# Patient Record
Sex: Female | Born: 1935 | Race: White | Hispanic: No | Marital: Married | Smoking: Former smoker
Health system: Southern US, Community
[De-identification: ages and names within clinical notes are randomized; demographics above are authoritative.]

## PROBLEM LIST (undated history)

## (undated) DIAGNOSIS — F329 Major depressive disorder, single episode, unspecified: Secondary | ICD-10-CM

## (undated) DIAGNOSIS — E119 Type 2 diabetes mellitus without complications: Secondary | ICD-10-CM

## (undated) DIAGNOSIS — N393 Stress incontinence (female) (male): Secondary | ICD-10-CM

## (undated) DIAGNOSIS — I10 Essential (primary) hypertension: Secondary | ICD-10-CM

## (undated) DIAGNOSIS — E7800 Pure hypercholesterolemia, unspecified: Secondary | ICD-10-CM

## (undated) DIAGNOSIS — M1990 Unspecified osteoarthritis, unspecified site: Secondary | ICD-10-CM

## (undated) DIAGNOSIS — F32A Depression, unspecified: Secondary | ICD-10-CM

## (undated) DIAGNOSIS — N390 Urinary tract infection, site not specified: Secondary | ICD-10-CM

## (undated) DIAGNOSIS — E079 Disorder of thyroid, unspecified: Secondary | ICD-10-CM

## (undated) DIAGNOSIS — K219 Gastro-esophageal reflux disease without esophagitis: Secondary | ICD-10-CM

## (undated) DIAGNOSIS — I639 Cerebral infarction, unspecified: Secondary | ICD-10-CM

## (undated) HISTORY — DX: Pure hypercholesterolemia, unspecified: E78.00

## (undated) HISTORY — DX: Gastro-esophageal reflux disease without esophagitis: K21.9

## (undated) HISTORY — DX: Cerebral infarction, unspecified: I63.9

## (undated) HISTORY — DX: Disorder of thyroid, unspecified: E07.9

## (undated) HISTORY — PX: BACK SURGERY: SHX140

## (undated) HISTORY — DX: Stress incontinence (female) (male): N39.3

## (undated) HISTORY — DX: Urinary tract infection, site not specified: N39.0

## (undated) HISTORY — DX: Unspecified osteoarthritis, unspecified site: M19.90

## (undated) HISTORY — DX: Major depressive disorder, single episode, unspecified: F32.9

## (undated) HISTORY — DX: Type 2 diabetes mellitus without complications: E11.9

## (undated) HISTORY — DX: Essential (primary) hypertension: I10

## (undated) HISTORY — DX: Depression, unspecified: F32.A

---

## 1975-11-20 HISTORY — PX: ABDOMINAL HYSTERECTOMY: SHX81

## 1977-11-19 HISTORY — PX: APPENDECTOMY: SHX54

## 2004-08-19 ENCOUNTER — Ambulatory Visit: Payer: Self-pay

## 2004-09-19 ENCOUNTER — Ambulatory Visit: Payer: Self-pay

## 2007-03-04 ENCOUNTER — Other Ambulatory Visit: Payer: Self-pay

## 2007-03-04 ENCOUNTER — Observation Stay: Payer: Self-pay | Admitting: Internal Medicine

## 2007-11-05 ENCOUNTER — Emergency Department: Payer: Self-pay | Admitting: Internal Medicine

## 2007-11-05 ENCOUNTER — Other Ambulatory Visit: Payer: Self-pay

## 2010-02-08 ENCOUNTER — Ambulatory Visit: Payer: Self-pay | Admitting: Gastroenterology

## 2011-02-12 ENCOUNTER — Observation Stay: Payer: Self-pay | Admitting: Internal Medicine

## 2012-12-03 ENCOUNTER — Inpatient Hospital Stay: Payer: Self-pay | Admitting: Internal Medicine

## 2012-12-03 LAB — CBC
HCT: 31.1 % — ABNORMAL LOW (ref 35.0–47.0)
HGB: 10.6 g/dL — ABNORMAL LOW (ref 12.0–16.0)
MCH: 29.8 pg (ref 26.0–34.0)
MCHC: 33.9 g/dL (ref 32.0–36.0)
MCV: 88 fL (ref 80–100)
Platelet: 289 10*3/uL (ref 150–440)
RDW: 14.6 % — ABNORMAL HIGH (ref 11.5–14.5)
WBC: 14.7 10*3/uL — ABNORMAL HIGH (ref 3.6–11.0)

## 2012-12-03 LAB — URINALYSIS, COMPLETE
Bilirubin,UR: NEGATIVE
Blood: NEGATIVE
Hyaline Cast: 2
Nitrite: NEGATIVE
Protein: 100
Specific Gravity: 1.02 (ref 1.003–1.030)
Squamous Epithelial: 1

## 2012-12-03 LAB — COMPREHENSIVE METABOLIC PANEL
Albumin: 2.6 g/dL — ABNORMAL LOW (ref 3.4–5.0)
Alkaline Phosphatase: 128 U/L (ref 50–136)
Anion Gap: 8 (ref 7–16)
BUN: 26 mg/dL — ABNORMAL HIGH (ref 7–18)
Bilirubin,Total: 0.5 mg/dL (ref 0.2–1.0)
Calcium, Total: 9.1 mg/dL (ref 8.5–10.1)
Chloride: 93 mmol/L — ABNORMAL LOW (ref 98–107)
Co2: 32 mmol/L (ref 21–32)
Creatinine: 0.72 mg/dL (ref 0.60–1.30)
EGFR (African American): >60
Glucose: 177 mg/dL — ABNORMAL HIGH (ref 65–99)
Osmolality: 275 (ref 275–301)
SGPT (ALT): 18 U/L (ref 12–78)

## 2012-12-03 LAB — RAPID INFLUENZA A&B ANTIGENS

## 2012-12-03 LAB — TROPONIN I: Troponin-I: <0.02 ng/mL

## 2012-12-04 LAB — CBC WITH DIFFERENTIAL/PLATELET
Bands: 7 %
HGB: 9.1 g/dL — ABNORMAL LOW (ref 12.0–16.0)
Lymphocytes: 13 %
MCHC: 32.1 g/dL (ref 32.0–36.0)
Monocytes: 6 %
Platelet: 310 10*3/uL (ref 150–440)
RDW: 14.8 % — ABNORMAL HIGH (ref 11.5–14.5)

## 2012-12-04 LAB — BASIC METABOLIC PANEL
Anion Gap: 8 (ref 7–16)
Calcium, Total: 8.2 mg/dL — ABNORMAL LOW (ref 8.5–10.1)
Chloride: 101 mmol/L (ref 98–107)
Creatinine: 0.65 mg/dL (ref 0.60–1.30)
EGFR (African American): >60
Potassium: 3.3 mmol/L — ABNORMAL LOW (ref 3.5–5.1)
Sodium: 136 mmol/L (ref 136–145)

## 2012-12-04 LAB — FERRITIN: Ferritin (ARMC): 319 ng/mL (ref 8–388)

## 2012-12-05 LAB — CBC WITH DIFFERENTIAL/PLATELET
Basophil %: 0.3 %
Eosinophil #: 0 10*3/uL (ref 0.0–0.7)
Eosinophil %: 0.2 %
HCT: 27.9 % — ABNORMAL LOW (ref 35.0–47.0)
HGB: 8.8 g/dL — ABNORMAL LOW (ref 12.0–16.0)
Lymphocyte #: 0.8 10*3/uL — ABNORMAL LOW (ref 1.0–3.6)
MCHC: 31.6 g/dL — ABNORMAL LOW (ref 32.0–36.0)
MCV: 88 fL (ref 80–100)
Monocyte %: 5.2 %
Neutrophil %: 90 %
Platelet: 357 10*3/uL (ref 150–440)
RBC: 3.17 10*6/uL — ABNORMAL LOW (ref 3.80–5.20)

## 2012-12-05 LAB — BASIC METABOLIC PANEL
Anion Gap: 10 (ref 7–16)
BUN: 8 mg/dL (ref 7–18)
Calcium, Total: 8.4 mg/dL — ABNORMAL LOW (ref 8.5–10.1)
Chloride: 103 mmol/L (ref 98–107)
Co2: 22 mmol/L (ref 21–32)
EGFR (African American): >60
EGFR (Non-African Amer.): >60
Glucose: 198 mg/dL — ABNORMAL HIGH (ref 65–99)

## 2012-12-05 LAB — OCCULT BLOOD X 1 CARD TO LAB, STOOL: Occult Blood, Feces: NEGATIVE

## 2012-12-05 LAB — URINE CULTURE

## 2012-12-05 LAB — CLOSTRIDIUM DIFFICILE BY PCR

## 2012-12-06 LAB — BASIC METABOLIC PANEL
Anion Gap: 8 (ref 7–16)
BUN: 8 mg/dL (ref 7–18)
Calcium, Total: 8.7 mg/dL (ref 8.5–10.1)
Co2: 26 mmol/L (ref 21–32)
Creatinine: 0.58 mg/dL — ABNORMAL LOW (ref 0.60–1.30)
EGFR (African American): >60
Potassium: 4.4 mmol/L (ref 3.5–5.1)

## 2012-12-06 LAB — CBC WITH DIFFERENTIAL/PLATELET
Basophil #: 0.1 10*3/uL (ref 0.0–0.1)
Basophil %: 0.3 %
Eosinophil #: 0 10*3/uL (ref 0.0–0.7)
Eosinophil %: 0.2 %
HGB: 8.6 g/dL — ABNORMAL LOW (ref 12.0–16.0)
Lymphocyte #: 1.7 10*3/uL (ref 1.0–3.6)
Lymphocyte %: 7.8 %
MCH: 28 pg (ref 26.0–34.0)
MCV: 89 fL (ref 80–100)
Monocyte #: 0.8 x10 3/mm (ref 0.2–0.9)
Neutrophil #: 19 10*3/uL — ABNORMAL HIGH (ref 1.4–6.5)
RBC: 3.09 10*6/uL — ABNORMAL LOW (ref 3.80–5.20)
WBC: 21.6 10*3/uL — ABNORMAL HIGH (ref 3.6–11.0)

## 2012-12-06 LAB — RAPID INFLUENZA A&B ANTIGENS

## 2012-12-07 LAB — CBC WITH DIFFERENTIAL/PLATELET
Basophil #: 0.1 10*3/uL (ref 0.0–0.1)
Basophil %: 0.6 %
Eosinophil %: 1.3 %
HCT: 26.4 % — ABNORMAL LOW (ref 35.0–47.0)
HGB: 8.7 g/dL — ABNORMAL LOW (ref 12.0–16.0)
Lymphocyte #: 1 10*3/uL (ref 1.0–3.6)
Lymphocyte %: 6.4 %
MCH: 29.2 pg (ref 26.0–34.0)
MCV: 89 fL (ref 80–100)
Monocyte #: 0.7 x10 3/mm (ref 0.2–0.9)
Monocyte %: 5 %
Neutrophil %: 86.7 %
Platelet: 451 10*3/uL — ABNORMAL HIGH (ref 150–440)
RBC: 2.98 10*6/uL — ABNORMAL LOW (ref 3.80–5.20)
RDW: 15.1 % — ABNORMAL HIGH (ref 11.5–14.5)
WBC: 14.9 10*3/uL — ABNORMAL HIGH (ref 3.6–11.0)

## 2012-12-07 LAB — BASIC METABOLIC PANEL
BUN: 8 mg/dL (ref 7–18)
Chloride: 102 mmol/L (ref 98–107)
Co2: 27 mmol/L (ref 21–32)
Creatinine: 0.53 mg/dL — ABNORMAL LOW (ref 0.60–1.30)
EGFR (Non-African Amer.): >60
Glucose: 183 mg/dL — ABNORMAL HIGH (ref 65–99)
Osmolality: 275 (ref 275–301)
Potassium: 4.4 mmol/L (ref 3.5–5.1)
Sodium: 136 mmol/L (ref 136–145)

## 2012-12-08 LAB — CBC WITH DIFFERENTIAL/PLATELET
Bands: 3 %
HCT: 25 % — ABNORMAL LOW (ref 35.0–47.0)
HGB: 8.4 g/dL — ABNORMAL LOW (ref 12.0–16.0)
Lymphocytes: 20 %
MCHC: 33.8 g/dL (ref 32.0–36.0)
Metamyelocyte: 2 %
Monocytes: 4 %
Myelocyte: 2 %
Platelet: 479 10*3/uL — ABNORMAL HIGH (ref 150–440)
WBC: 9.4 10*3/uL (ref 3.6–11.0)

## 2012-12-09 LAB — CULTURE, BLOOD (SINGLE)

## 2013-04-29 ENCOUNTER — Ambulatory Visit: Payer: Self-pay | Admitting: Family Medicine

## 2013-08-02 ENCOUNTER — Emergency Department: Payer: Self-pay | Admitting: Emergency Medicine

## 2013-08-02 LAB — URINALYSIS, COMPLETE
Bacteria: NONE SEEN
Bilirubin,UR: NEGATIVE
Glucose,UR: NEGATIVE mg/dL (ref 0–75)
Ph: 6 (ref 4.5–8.0)
Protein: 25
RBC,UR: 1 /HPF (ref 0–5)
Squamous Epithelial: 1
WBC UR: 4 /HPF (ref 0–5)

## 2013-08-02 LAB — COMPREHENSIVE METABOLIC PANEL
Albumin: 3.8 g/dL (ref 3.4–5.0)
Alkaline Phosphatase: 71 U/L (ref 50–136)
BUN: 21 mg/dL — ABNORMAL HIGH (ref 7–18)
Co2: 27 mmol/L (ref 21–32)
EGFR (African American): >60
EGFR (Non-African Amer.): >60
Potassium: 4.1 mmol/L (ref 3.5–5.1)
SGOT(AST): 19 U/L (ref 15–37)
SGPT (ALT): 18 U/L (ref 12–78)
Total Protein: 7.8 g/dL (ref 6.4–8.2)

## 2013-08-02 LAB — CBC
HCT: 33.9 % — ABNORMAL LOW (ref 35.0–47.0)
MCHC: 33.3 g/dL (ref 32.0–36.0)
RBC: 3.93 10*6/uL (ref 3.80–5.20)
RDW: 13.7 % (ref 11.5–14.5)

## 2014-03-31 ENCOUNTER — Ambulatory Visit: Payer: Self-pay | Admitting: Adult Health

## 2014-10-27 ENCOUNTER — Ambulatory Visit: Payer: Self-pay | Admitting: Family Medicine

## 2015-03-11 NOTE — Consult Note (Signed)
Impression: 81yo female w/ h/o DM, hypothyroidism and scleroderma admitted with pneumonia and UTI with worsening and leukocytosis.  She states that she had cough, weakness and fever at home.  Her initial CXR did not show an obvious infiltrate, but the CT demonstated an infiltrate.  Her urine showed inflammation and she had a positive culture.  She was improving yesterday, but was more fatigued today with some diarrhea and increased leukocytosis. Would follow her CBC.  If her WBC comes back down, then today's value may just have been spurious. If she has further diarrhea will send a C. diff PCR. Will repeat her CXR.  She was dehydrated on admission and the CXR may have been falsely negative. Levofloxacin would be adequate to cover both the UTI and possible pneumonia.  Will change her to po. If her CXR is negative, would cut the dose down to 250mg  daily.    Electronic Signatures: Kipp Shank, Rosalyn GessMichael E (MD) (Signed on 17-Jan-14 15:46)  Authored   Last Updated: 17-Jan-14 16:00 by Yale Golla, Rosalyn GessMichael E (MD)

## 2015-03-11 NOTE — Consult Note (Signed)
PATIENT NAME:  Andrea Shah, Andrea Shah MR#:  [id number] DATE OF BIRTH:  [birth year]  DATE OF CONSULTATION:  12/05/2012  REFERRING PHYSICIAN:  Dr. Allena Katz. CONSULTING PHYSICIAN:  Rosalyn Gess. Carter Kassel, MD  REASON FOR CONSULTATION:  Urinary tract infection with worsening leukocytosis.   HISTORY OF PRESENT ILLNESS:  The patient is a 81 year old female with a past history significant for diabetes, hypothyroidism and scleroderma who was admitted on January 15th with approximately 5 to 6 days of cough, weakness, decreased appetite, and fever.  The patient contacted her primary care doctor and was given a Z-Pak.  She was also given ciprofloxacin otic drops for some ear pain.  She continued to worsen and her family noted her to be more and more weak and brought her to the Emergency Room.  She has been given IV fluids.  A chest x-ray showed no evidence for obvious infiltrate, but urinalysis was positive and urine culture is growing Klebsiella.  She was started on IV Levaquin.  Initially she had improved, however over the last 24 hours her white count, which was coming down, has gone up and her weakness which had been doing better has also worsened.  She states the cough she was having was nonproductive.  She also has had significant pain in her left arm and rib cage.  These symptoms are consistent with her usual scleroderma discomfort which worsens with changing weather.  She currently is feeling somewhat poorly.  She had noted several loose bowel movements over the last 24 hours having had 3 between yesterday evening and today.   ALLERGIES:  IVP DYE, PENICILLIN AND SULFA DRUGS.   PAST MEDICAL HISTORY: 1.  Diabetes.  2.  Hypothyroidism.  3.  Scleroderma.  4.  Hypertension.  5.  Hypercholesterolemia.  6.  Anxiety.  7.  GERD.   SOCIAL HISTORY:  The patient lives by herself.  She is a prior smoker having quit 20 years ago.  She does not drink.  She does not have any history of injecting drug use.   FAMILY HISTORY:   Positive for MI, diabetes and congestive heart failure.   REVIEW OF SYSTEMS:  GENERAL:  Positive fevers, chills and malaise.  Positive for generalized weakness.  HEENT:  No headaches.  No sinus congestion.  No sore throat.  NECK:  No stiffness.  No swollen glands.  RESPIRATORY:  She has had dry cough without sputum production.  She has had no wheezing.  She has had some mild shortness of breath.  CARDIAC:  No chest pains or palpitations.  GASTROINTESTINAL:  Occasional nausea.  No vomiting, no abdominal pain.  She had 2 episodes of diarrhea at home, but these were only after she had taken some bicarbonate.  Since being in the hospital over the last day she has had several episodes of loose stool.  GENITOURINARY:  She denies any dysuria.  She has not had increased frequency, although she has had increased urgency.  Her family states that they brought her to the bathroom several times at her request, but she has been unable to urinate.  She denies any flank pain.  She has had no hematuria.  MUSCULOSKELETAL:  She has generalized malaise, but no frank arthritis.  Her left arm is significantly weaker than her right, but this is long-standing.  She has no new musculoskeletal complaints.  SKIN:  No rashes.    NEUROLOGIC:  She is generalized weak, but not focally weak other than the left arm which is chronic.  She has had no  slurring of speech.  She has been somewhat more confused on admission, but this has gotten better.  PSYCHIATRIC:  No complaints.  All other systems are negative.   PHYSICAL EXAMINATION: VITAL SIGNS:  T-max of 101.4, T-current of 98.0, pulse 89, blood pressure 132/75, 93% on room air.  GENERAL:  A 81 year old white female in no acute distress.  HEENT:  Normocephalic, atraumatic.  Pupils equal and reactive to light.  Extraocular motion intact.  Sclerae, conjunctivae and lids are without evidence for emboli or petechiae.  Oropharynx shows no erythema or exudate.  Teeth and gums are in  fair condition.  NECK:  Supple.  Full range of motion.  Midline trachea.  No lymphadenopathy.  No thyromegaly.  CHEST:  Clear to auscultation bilaterally with good air movement.  No focal consolidation.  CARDIAC:  Regular rate and rhythm without murmur, rub or gallop.  ABDOMEN:  Soft, nontender, nondistended.  No hepatosplenomegaly.  No hernias noted. EXTREMITIES:  No evidence for tenosynovitis.  The left arm has evidence for a loss of muscle mass and some degree of weakness compared to the right.  There was no evidence for tenosynovitis.  SKIN:  No rashes.  No stigmata of endocarditis, specifically no Janeway lesions or Osler nodes.  NEUROLOGIC:  The patient was awake and interactive.  At times she would answer questions that her family would disagree with her answer, seemingly indicating she was confused about the answer, but she was able to provide a fairly coherent history of recent events.  PSYCHIATRIC:  Mood and affect appeared normal.   LABORATORY DATA:  BUN of 8, creatinine 0.53, bicarbonate 22, anion gap of 10.  LFTs were within normal limits.  White count today is 17.5 with a hemoglobin 8.8, platelet count of 357, ANC of 15.7.  White count on admission was 14.7 and came down to 12.4 yesterday.  Blood cultures show no growth to date.  Rapid influenza testing was negative.  A urinalysis had 100 mg/dL of protein, negative nitrites, 2+ leukocyte esterase, 2 red cells and 310 white cells.  A urine culture is growing greater than 100,000 CFUs per mL of Klebsiella species.  A 3-way of the abdomen on admission showed no evidence of bowel obstruction or perforation.  There were mildly increased interstitial markings that may reflect edema.  A CT scan of the abdomen and pelvis without contrast demonstrated evidence for a left lower lobe pneumonia.  Diverticulosis of the sigmoid colon.   IMPRESSION:  A 81 year old female with a history of diabetes, hypothyroidism, scleroderma admitted with pneumonia and  urinary tract infection with a worsening leukocytosis.   RECOMMENDATIONS:   1.  She states that she had cough, weakness and fever at home.  Her initial chest x-ray did not show an obvious infiltrate, but the CT demonstrated an infiltrate.  Her urine showed inflammation and she had a positive culture.  She was improving yesterday, but she was more fatigued today with some diarrhea and increased leukocytosis.  2.  We will follow her CBC.  If her white count comes back down then today's value may have just been spurious.  3.  If she has further diarrhea would send a Clostridium difficile PCR.   4.  We will repeat her chest x-ray.  If she was dehydrated on admission then the chest x-ray may be falsely negative.  A repeat x-ray would allow more adequate followup going forward.  5.  Levaquin should be adequate to cover both urinary tract infection and possible pneumonia.  We will change her to p.o. as it is 100% bioavailable.    This is a moderately complex infectious disease consult.  Thank you very much for involving me in the patient's care.      ____________________________ Rosalyn Gess. Sadhana Frater, MD meb:ea D: 12/05/2012 16:01:14 ET T: 12/05/2012 23:27:34 ET JOB#: [id number]  cc: Rosalyn Gess. Charlott Calvario, MD, <Dictator> Naheim Burgen E Neysha Criado MD ELECTRONICALLY SIGNED 12/15/2012 15:26

## 2015-03-11 NOTE — Discharge Summary (Signed)
DATE OF BIRTH:  Mar 12, 1933  ADMITTING DIAGNOSES:  Weakness, poor p.o. intake, productive cough.   DISCHARGE DIAGNOSES:   1.  Generalized weakness.  2.  Decrease in p.o. intake due to some dehydration, urinary tract infection, as well as pneumonia.  3.  Hypokalemia, likely due to poor p.o. intake, as well as diuretic therapy, now replaced.  4.  Urinary tract infection due to Klebsiella pneumonia, status post treatment.  5.  Leukocytosis related to urinary tract infection and pneumonia, now resolved.  6.  Diabetes. Metformin is resumed on discharge.  7.  Hypertension. The patient's hydrochlorothiazide and lisinopril have been held. Likely can resume in the near future.  8.  Hypothyroidism.  9.  Hyperlipidemia.  10.  Anemia, likely anemia with iron deficiency. The patient needs to follow up with primary care physician and compare previous CBCs. If not up-to-date on colonoscopy, will need a colonoscopy. Her guaiac stool was negative.  11.  Hypoxia on presentation, felt to be due to possible pneumonia. Has been treated, and her symptoms have improved.  12.  Abdominal pain with coughing, likely the cause for her symptoms.   CONSULTANTS:  Dr. Leavy Cella.   PERTINENT LABORATORIES AND EVALUATIONS:  EKG on admission showed normal sinus rhythm, possible left atrial enlargement. Troponin was less than 0.02. BNP was 743. Influenza A and B were negative. BMP: Glucose was 177, BUN 26, creatinine 0.72, sodium 133, potassium was 2.6, chloride 93, CO2 was 32, calcium was 9.1. LFTs were normal. Her WBC count was 14.7, hemoglobin 10.6, platelet count 289. Blood culture showed no growth. Chest x-ray showed mildly increased interstitial markings. Urine culture showed Klebsiella pneumonia. Folic acid level is 12.6. B12 level was elevated at greater than 1999, iron level was 9, ferritin, however, was 319. Most recent WBC count today is 9.4, hemoglobin 8.4. Her sodium was 136 on January 19th. Potassium was 4.4.   HOSPITAL  COURSE:  Please refer to H and P done by the admitting physician. The patient is a 81 year old white female, who presented with generalized weakness, productive cough, severe hypokalemia, who was admitted through the ED. Initially, she was thought to have a UTI, but she was also having cough-like symptoms. She was admitted for IV antibiotics, IV hydration. With these treatments, she got a little better the first day, and then she worsened with increasing cough, left upper quadrant abdominal pain. Therefore, CT scan of the abdomen was done, which showed a left lower lobe pneumonia, likely causing the cough. Initially, she was placed on Levaquin. However, her WBC count continued to go up. Therefore, ID consult was obtained. She was switched to ceftriaxone and azithromycin. With that treatment, her WBC count started trending downwards. Now, WBC count is normal. She has been afebrile. She is doing much better and is stable for discharge. In terms of her electrolyte abnormalities, her potassium was replaced and now is normal. She was also noted to be anemic. She states that she has been intermittently anemic in the past. She does have some iron deficiency. She has been started on iron supplements. Her guaiac stools were negative. She will need to follow up with her primary M.D. to make sure she is up-to-date on colonoscopy; if not, she will need further GI evaluation. At this time, she is stable for discharge.   DISCHARGE MEDICATIONS:  Alprazolam 0.5 one tab p.o. t.i.d., Zetia 10 at bedtime, Soma as taking before at home, dipyridamole 50 one tab p.o. t.i.d., Prilosec 20 mg daily, aspirin 81 one tab p.o.  daily, metformin 500 two tabs 2 times per day, multivitamin p.o. daily, Tylenol 650 one to two tabs q. 6 p.r.n., doxepin 20 two tabs at bedtime, Allegra 25 one tab p.o. daily, Synthroid 100 mcg daily, Ambien 12.5 at bedtime, azithromycin 500 one tab p.o. daily for 4 days, chlorpheniramine-hydrocodone Tussionex syrup 5  mL q. 12 as needed for cough, Mucinex 600 one tab p.o. b.i.d., Ceftin 500 one tab p.o. b.i.d. for 4 days, iron 150 mg 1 tab p.o. b.i.d. The patient is recommended to stop taking lisinopril and hydrochlorothiazide until seen by her primary care provider.   DIET:  Low sodium.   ACTIVITY:  As tolerated.   FOLLOWUP:  With Dr. Terance HartBronstein in 1 to 2 weeks.   TIME SPENT:  35 minutes.   ____________________________ Lacie ScottsShreyang H. Allena KatzPatel, MD shp:ms D: 12/08/2012 16:39:08 ET T: 12/08/2012 23:41:52 ET JOB#: [id number]  cc: Safari Cinque H. Allena KatzPatel, MD, <Dictator> Teena Iraniavid M. Terance HartBronstein, MD Charise CarwinSHREYANG H Audrina Marten MD ELECTRONICALLY SIGNED 12/10/2012 11:24

## 2015-03-11 NOTE — H&P (Signed)
PATIENT NAME:  Andrea Shah, Andrea Shah MR#:  [id number] DATE OF BIRTH:  Sep 21, 1933  DATE OF ADMISSION:  12/03/2012  PRIMARY CARE PHYSICIAN:  Dorothey Basemanavid Bronstein, MD  CHIEF COMPLAINT: Weakness, poor p.o. intake and cough.   HISTORY OF PRESENT ILLNESS: This is a 81 year old female who comes into the Emergency Room due to a 2 to 3 day history of generalized weakness, cough and poor p.o. intake. The patient apparently got ill around Thursday of last week. She has a history of chronic back pain and her back pain got a bit worse due to the change in weather. She tried to control her back pain with some Tylenol and some Salonpas patches although it did not improve her symptoms.  She had started to become more and more weak over the past few days. She also has poor p.o. intake and has developed a cough. The patient has been staying with her daughter, normally is fairly independent and today the daughter noticed that she is barely able to get out of bed and, therefore, brought her to the ER for further evaluation. In the Emergency Room, the patient was noted to be mildly hypoxic with room air O2 sats in the in the low 90s. She was also noted to have a urinary tract infection and noted to be acutely hypokalemic. Hospitalist services were contacted for further treatment and evaluation.   The patient does admit to some generalized weakness. Otherwise, positive low-grade fevers of 100.4. No nausea, no vomiting, no abdominal pain, no dysuria, no hematuria. No other associated symptoms presently.   REVIEW OF SYSTEMS:  CONSTITUTIONAL: Positive documented fever, low-grade of 100.4. No weight gain or weight loss. Positive generalized weakness.  EYES: No blurry or double vision.  ENT: No tinnitus. No postnasal drip. No redness of oropharynx. RESPIRATORY: Positive cough. No wheeze, no hemoptysis. No COPD. CARDIOVASCULAR: No chest pain, no orthopnea, no palpitations, no syncope.  GASTROINTESTINAL: Positive nausea. No vomiting, no  diarrhea, no abdominal pain, no  melena or hematochezia. GENITOURINARY: No dysuria, no hematuria, no polyuria.  ENDOCRINE:  No polyuria or nocturia.  No heat or cold intolerance.  HEMATOLOGIC:  No anemia, no bruising, no bleeding.  INTEGUMENT:  No rashes. No lesions.  MUSCULOSKELETAL: No arthritis, no swelling, no gout. NEUROLOGIC: No numbness or tingling. No ataxia, no seizure-type activity.  PSYCHIATRIC:  Positive anxiety. No insomnia, no ADD.   PAST MEDICAL HISTORY: 1.  Diabetes.  2.  Hypertension.  3.  Hyperlipidemia.  4.  Anxiety. 5.  GERD 6.  Hypothyroidism.   ALLERGIES:  PENICILLIN AND SULFA DRUGS, BOTH OF WHICH CAUSE ANAPHYLAXIS.  SOCIAL HISTORY: Used to be a smoker, quit about 20+ years ago. No alcohol abuse. No illicit drug abuse. Lives by herself.   FAMILY HISTORY: Father died from a myocardial infarction and mother had diabetes and congestive heart failure.   CURRENT MEDICATIONS:  Xanax 0.5 mg t.i.d. Aspirin 81 mg daily. Zithromax just started this past Sunday. Dipyridamole 50 mg t.i.d. Doxepin 25 mg 2 tabs at bedtime,.  Hydrochlorothiazide/lisinopril 12.5/10 mg 1 tab daily.  Metformin 5 mg 2 tabs b.i.d.  Multivitamin daily.  Prilosec 20 mg daily. Synthroid 100 mcg daily. Tylenol 650 every 4 to 6 hours.  Zetia 10 mg daily. Ambien 12.5 mg at bedtime as needed.   PHYSICAL EXAMINATION:  VITAL SIGNS:  Temperature 99.6, pulse 82, respirations 20, blood pressure 136/67, sats 99% on room air.  GENERAL: She is a pleasant appearing female, slightly lethargic but in no apparent distress.  HEENT: Atraumatic,  normocephalic. Extraocular muscles are intact. Pupils equal and reactive to light. Sclerae anicteric. No conjunctival injection. No pharyngeal erythema.  NECK: Supple. No jugular venous distention. No bruits, no lymphadenopathy, no thyromegaly.  HEART: Regular rate and rhythm. No murmurs, rubs or clicks.  LUNGS: Poor respiratory effort. Negative use of accessory muscle, dullness  to percussion. No rales, no rhonchi, no wheezes.   ABDOMEN: Soft, flat, nontender, nondistended. Has good bowel sounds. No hepatosplenomegaly appreciated.  EXTREMITIES: No evidence of any cyanosis, clubbing or peripheral edema. Has +2 pedal and radial pulses bilaterally.  NEUROLOGICAL: The patient is alert, awake and oriented x 3 with no focal motor or sensory deficits bilaterally.  SKIN: Moist and warm with no rash appreciated.  LYMPHATIC: There is no cervical or axillary lymphadenopathy.   LABORATORY AND DIAGNOSTIC DATA:  Glucose 177, BUN 26, creatinine 0.7, sodium 133, potassium 2.6, chloride 93, bicarb 32. LFTs are within normal limits. Troponin less than 0.02. White cell count 14.7, hemoglobin 10.6, hematocrit 31.1, platelet count 289.  Rapid flu was negative. Urinalysis positive for a UTI. The patient did have an abdominal 3-way done which showed no evidence of bowel obstruction or perforation, mildly increased interstitial markings that may reflect edema of the cardiac or noncardiac source, coarse retrocardiac lung markings on the left likely reflect atelectasis or developing pneumonia.   ASSESSMENT AND PLAN: This is a 81 year old female with history of hypertension, diabetes, GERD, anxiety, history of chronic back pain, hypothyroidism and hyperlipidemia presents to the hospital with weakness, cough, shortness of breath and poor p.o. intake.  PROBLEM #1:  Generalized weakness.  The likely cause of this is possibly underlying acute bronchitis complicated with underlying pneumonia and also dehydration. I will give her IV antibiotics like Levaquin to treat her UTI and follow her clinically. We will also give her IV fluids for her dehydration and get a physical therapy consult to assess her mobility.   PROBLEM #2:  Hypokalemia. This is likely secondary to poor p.o. intake. I will replace her potassium accordingly and repeat it in the morning. Will go ahead and check a magnesium level.   PROBLEM  #3:  Urinary tract infection. Again, as mentioned, will give her IV Levaquin, follow with urine culture.    PROBLEM #4:  Leukocytosis. This is likely related to her urinary tract infection. We will follow her white cell count after treatment with IV antibiotics.   PROBLEM #5:  Diabetes. Hold her metformin for now. Continue sliding scale insulin.   PROBLEM #6:  Hypertension.  Presently she is hemodynamically stable. Given her acute dehydration and UTI, we will hold hydrochlorothiazide and lisinopril and follow her hemodynamics.  PROBLEM #7:  Hypothyroidism. Continue Synthroid.   PROBLEM #8:  Hyperlipidemia. Continue Zetia.  PROBLEM #9:  GERD. Continue omeprazole.   PROBLEM #10:  Anxiety. Continue p.r.n. Xanax.   CODE STATUS: The patient is a full code.   Time spent on admission: 50 minutes.    ____________________________ Rolly Pancake. Cherlynn Kaiser, MD vjs:ct D: 12/03/2012 11:26:22 ET T: 12/03/2012 12:01:06 ET JOB#: [id number]  cc: Rolly Pancake. Cherlynn Kaiser, MD, <Dictator> Teena Irani. Terance Hart, MD Houston Siren MD ELECTRONICALLY SIGNED 12/05/2012 21:03

## 2016-02-07 ENCOUNTER — Ambulatory Visit (INDEPENDENT_AMBULATORY_CARE_PROVIDER_SITE_OTHER): Payer: Medicare Other | Admitting: Family Medicine

## 2016-02-07 VITALS — BP 152/82 | HR 92 | Temp 97.5°F | Ht 64.25 in | Wt 146.2 lb

## 2016-02-07 DIAGNOSIS — I10 Essential (primary) hypertension: Secondary | ICD-10-CM

## 2016-02-07 DIAGNOSIS — R278 Other lack of coordination: Secondary | ICD-10-CM | POA: Diagnosis not present

## 2016-02-07 DIAGNOSIS — D8989 Other specified disorders involving the immune mechanism, not elsewhere classified: Secondary | ICD-10-CM

## 2016-02-07 DIAGNOSIS — R002 Palpitations: Secondary | ICD-10-CM | POA: Diagnosis not present

## 2016-02-07 DIAGNOSIS — M359 Systemic involvement of connective tissue, unspecified: Secondary | ICD-10-CM

## 2016-02-07 DIAGNOSIS — D649 Anemia, unspecified: Secondary | ICD-10-CM | POA: Diagnosis not present

## 2016-02-07 DIAGNOSIS — M7989 Other specified soft tissue disorders: Secondary | ICD-10-CM

## 2016-02-07 DIAGNOSIS — F419 Anxiety disorder, unspecified: Secondary | ICD-10-CM

## 2016-02-07 LAB — CBC
HCT: 29.3 % — ABNORMAL LOW (ref 36.0–46.0)
Hemoglobin: 9.5 g/dL — ABNORMAL LOW (ref 12.0–15.0)
MCHC: 32.4 g/dL (ref 30.0–36.0)
MCV: 79.2 fl (ref 78.0–100.0)
PLATELETS: 270 10*3/uL (ref 150.0–400.0)
RBC: 3.7 Mil/uL — AB (ref 3.87–5.11)
RDW: 21.2 % — ABNORMAL HIGH (ref 11.5–15.5)
WBC: 7.8 10*3/uL (ref 4.0–10.5)

## 2016-02-07 LAB — COMPREHENSIVE METABOLIC PANEL
ALBUMIN: 4 g/dL (ref 3.5–5.2)
ALT: 10 U/L (ref 0–35)
AST: 17 U/L (ref 0–37)
Alkaline Phosphatase: 59 U/L (ref 39–117)
BUN: 39 mg/dL — ABNORMAL HIGH (ref 6–23)
CALCIUM: 10.2 mg/dL (ref 8.4–10.5)
CO2: 32 mEq/L (ref 19–32)
CREATININE: 0.93 mg/dL (ref 0.40–1.20)
Chloride: 100 mEq/L (ref 96–112)
GFR: 61.56 mL/min (ref >60.00)
Glucose, Bld: 158 mg/dL — ABNORMAL HIGH (ref 70–99)
Potassium: 4.4 mEq/L (ref 3.5–5.1)
Sodium: 138 mEq/L (ref 135–145)
Total Bilirubin: 0.4 mg/dL (ref 0.2–1.2)
Total Protein: 7 g/dL (ref 6.0–8.3)

## 2016-02-07 MED ORDER — HYDROCHLOROTHIAZIDE 25 MG PO TABS
25.0000 mg | ORAL_TABLET | Freq: Every day | ORAL | Status: AC
Start: 2016-02-07 — End: ?

## 2016-02-07 NOTE — Patient Instructions (Signed)
Nice to see you. We will refer you back to neurology for your sensory ataxia. We will repeat some lab work as well. Please consider coming off the doxepin. If you develop trouble swallowing, chest pain, shortness breath, palpitations, anxiety, depression, numbness, weakness, vision changes, or any new or change in symptoms please seek medical attention.

## 2016-02-07 NOTE — Progress Notes (Signed)
Patient ID: Andrea Shah, female   DOB: Sep 09, 1933, 82 y.o.   MRN: [id number]  [name], MD Phone: [phone]  Andrea Shah is a 82 y.o. female who presents today for new patient visit.  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 Previously evaluated by neurology and diagnosed with sensory ataxia. Notes she gets dizzy when she walks. Notes this is mildly progressive over the years. No acute changes. Notes no dizziness if she sits down or lays down. Notes if she doesn't have something to hold onto she will start panicking and she feels as though she is going to fall. Notes panicking sensation is a sensation of her heart racing. Notes she has arranged things in her house to help her get from point A to point B without having a panic attack. 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 Occurred when she was 75. She has no chest pain or shortness of breath with her panic attacks. No numbness or weakness. No vision changes.  Autoimmune disorder: Patient notes long history of a number of autoimmune disorders. She states she has scleroderma with skin changes in her left arm. Possibly has Sjogren's and lupus as well. Notes she has a dry mouth which leads to trouble swallowing. States she has to drink liquids when eating to help her swallow. She is unsure if food gets stuck. She has had multiple EGDs in the past.  Left leg swelling: Occurred several weeks ago. Occurred one time. This on from her ankle up to her knee. No edema at this time. No orthopnea or PND. No history of blood clot. She was evaluated by her prior physician and advised to prop up her leg. She has no swelling at this time.  Patient additionally reports a cough with her lisinopril. She does check her blood pressure and ranges from 139-170/68-83. No chest pain or shortness of breath.  Active Ambulatory Problems    Diagnosis Date Noted  . Sensory ataxia 02/09/2016  . Anxiety 02/09/2016  . Leg  swelling 02/09/2016  . Autoimmune disorder ([place]) 02/09/2016  . Essential hypertension 02/09/2016  . Anemia 02/09/2016  . Palpitations 02/09/2016   Resolved Ambulatory Problems    Diagnosis Date Noted  . No Resolved Ambulatory Problems   Past Medical History  Diagnosis Date  . Arthritis   . Depression   . Diabetes ([place])   . GERD (gastroesophageal reflux disease)   . Hypertension   . High cholesterol   . Thyroid dysfunction   . Stroke (cerebrum) ([place])   . Stress incontinence   . UTI (lower urinary tract infection)     Family History  Problem Relation Age of Onset  . Arthritis Father   . Breast cancer Sister   . Heart disease Father   . Heart disease Mother   . High blood pressure Mother   . Sudden death Other 82    Nephew, aneurysm  . Diabetes Mother   . Diabetes Brother     Social History   Social History  . Marital Status: Married    Spouse Name: N/A  . Number of Children: N/A  . Years of Education: N/A   Occupational History  . Not on file.   Social History Main Topics  . Smoking status: Former Research scientist (life sciences)  . Smokeless tobacco: Not on file  . Alcohol Use: No  . Drug Use: No  . Sexual Activity: Not on file   Other Topics Concern  .  Not on file   Social History Narrative  . No narrative on file    ROS   General:  Negative for nexplained weight loss, fever Skin: Negative for new or changing mole, sore that won't heal HEENT: Positive for trouble seeing, ringing in her ears, trouble swallowing, Negative for trouble hearing, mouth sores, hoarseness, change in voice. CV:  Positive for edema, palpitations, Negative for chest pain, dyspnea Resp: Negative for cough, dyspnea, hemoptysis GI: Negative for nausea, vomiting, diarrhea, constipation, abdominal pain, melena, hematochezia. GU: Positive for stress incontinence, Negative for dysuria, urinary hesitance, hematuria, vaginal or penile discharge, polyuria, sexual difficulty, lumps in testicle or breasts MSK:  Positive for muscle cramps or aches, joint pain or swelling Neuro: Positive for dizziness, Negative for headaches, weakness, numbness, passing out/fainting Psych: Positive for anxiety, Negative for depression, memory problems  Objective  Physical Exam Filed Vitals:   02/07/16 1017  BP: 152/82  Pulse: 92  Temp: 97.5 F (36.4 C)    BP Readings from Last 3 Encounters:  02/07/16 152/82   Wt Readings from Last 3 Encounters:  02/07/16 146 lb 4 oz (66.339 kg)    Physical Exam  Constitutional: She is well-developed, well-nourished, and in no distress.  HENT:  Head: Normocephalic and atraumatic.  Right Ear: External ear normal.  Left Ear: External ear normal.  Mouth/Throat: Oropharynx is clear and moist. No oropharyngeal exudate.  Eyes: Conjunctivae are normal. Pupils are equal, round, and reactive to light.  Neck: Neck supple.  Cardiovascular: Normal rate, regular rhythm and normal heart sounds.  Exam reveals no gallop and no friction rub.   No murmur heard. Pulmonary/Chest: Effort normal and breath sounds normal. No respiratory distress. She has no wheezes. She has no rales.  Abdominal: Soft. Bowel sounds are normal. She exhibits no distension. There is no tenderness. There is no rebound and no guarding.  Musculoskeletal:  Bilateral lower extremities with no edema or tenderness  Lymphadenopathy:    She has no cervical adenopathy.  Neurological: She is alert.  Vision absent from right eye, no pupillary constriction in the right eye with direct light, there is pupillary constriction in the right eye with light shining in the left eye, otherwise CN 2-12 intact, 5/5 strength in bilateral biceps, triceps, grip, quads, hamstrings, plantar and dorsiflexion, sensation to light touch intact in bilateral UE and LE, deliberate though normal gait, 2+ patellar reflexes, negative Romberg, no pronator drift  Skin: Skin is warm and dry. She is not diaphoretic.  Contraction of skin in left lower  arm, nontender, bilateral hands are warm and well perfused  Psychiatric:  Affect anxious, mood anxious   EKG: Normal sinus rhythm, rate 75, no ST or T-wave changes  Assessment/Plan:   Sensory ataxia Patient notes this is stable. Does have some anxiety and panic surrounding this. Neurologically intact at this time. We will have her follow-up with neurology for this issue. She is given return precautions.  Anxiety Patient with significant anxiety and panic attacks relating to her sensory ataxia. Currently taking Xanax and doxepin. I discussed alternative medicines other than doxepin. Discussed potential for increasing dose of Xanax though this could make her drowsy and unsteady. Offered therapist though patient declined. Patient will continue to monitor at this time. Given return precautions.  Leg swelling Vision had a single episode of left leg swelling that resolved within 24 hours. No recurrence. Benign exam today. Discussed possible causes. Doubt DVT given lack of history and quick resolution. Could be venous insufficiency. Unlikely heart failure given  unilateral swelling. Patient will continue to monitor. She is given return precautions.  Autoimmune disorder [facility]) Patient with a variety of reported autoimmune disorders. She does have evidence of scleroderma in her left lower arm and likely has Sjogren's given dry mouth and need to swallow with liquids. Given reported difficulty swallowing I advised GI evaluation, though she reports she has seen him previously and that they would do nothing for her so she declined. Given variety of autoimmune issues that she reports I discussed seeing a rheumatologist though she declined this as well. She would prefer to continue to monitor this at this time. She is given return precautions.  Essential hypertension Patient with a history of hypertension. Appears to be moderately uncontrolled at home. Cough with lisinopril. I discussed medication options with  her including switch to losartan or increase her HCTZ. Patient opted for increasing the dose of her HCTZ for medication simplicity sake. This was sent to her pharmacy. Continue to monitor blood pressure.  Anemia Noted on review of lab work. She is taking iron. We will check a CBC.  Palpitations Patient reports palpitations associated with her anxiety. Does not occur at any other time other than her anxiety. Her EKG is reassuring. We will check lab work as outlined below. She will continue to monitor. If they are recurrent or occur at other times other than when she is anxious we will have her evaluated by cardiology. Given return precautions.    Orders Placed This Encounter  Procedures  . CBC  . Comp Met (CMET)  . Ambulatory referral to Neurology    Referral Priority:  Routine    Referral Type:  Consultation    Referral Reason:  Specialty Services Required    Requested Specialty:  Neurology    Number of Visits Requested:  1  . EKG 12-Lead    [name], MD [place]

## 2016-02-07 NOTE — Progress Notes (Signed)
Pre visit review using our clinic review tool, if applicable. No additional management support is needed unless otherwise documented below in the visit note. 

## 2016-02-08 ENCOUNTER — Encounter: Payer: Self-pay | Admitting: Surgical

## 2016-02-09 ENCOUNTER — Encounter: Payer: Self-pay | Admitting: Family Medicine

## 2016-02-09 DIAGNOSIS — M7989 Other specified soft tissue disorders: Secondary | ICD-10-CM | POA: Insufficient documentation

## 2016-02-09 DIAGNOSIS — F419 Anxiety disorder, unspecified: Secondary | ICD-10-CM | POA: Insufficient documentation

## 2016-02-09 DIAGNOSIS — R002 Palpitations: Secondary | ICD-10-CM | POA: Insufficient documentation

## 2016-02-09 DIAGNOSIS — D8989 Other specified disorders involving the immune mechanism, not elsewhere classified: Secondary | ICD-10-CM | POA: Insufficient documentation

## 2016-02-09 DIAGNOSIS — R278 Other lack of coordination: Secondary | ICD-10-CM | POA: Insufficient documentation

## 2016-02-09 DIAGNOSIS — D649 Anemia, unspecified: Secondary | ICD-10-CM | POA: Insufficient documentation

## 2016-02-09 DIAGNOSIS — I10 Essential (primary) hypertension: Secondary | ICD-10-CM | POA: Insufficient documentation

## 2016-02-09 NOTE — Assessment & Plan Note (Addendum)
Patient with significant anxiety and panic attacks relating to her sensory ataxia. Currently taking Xanax and doxepin. I discussed alternative medicines other than doxepin. Discussed potential for increasing dose of Xanax though this could make her drowsy and unsteady. Offered therapist though patient declined. Patient will continue to monitor at this time. Given return precautions.

## 2016-02-09 NOTE — Assessment & Plan Note (Signed)
Patient notes this is stable. Does have some anxiety and panic surrounding this. Neurologically intact at this time. We will have her follow-up with neurology for this issue. She is given return precautions.

## 2016-02-09 NOTE — Assessment & Plan Note (Signed)
Patient with a history of hypertension. Appears to be moderately uncontrolled at home. Cough with lisinopril. I discussed medication options with her including switch to losartan or increase her HCTZ. Patient opted for increasing the dose of her HCTZ for medication simplicity sake. This was sent to her pharmacy. Continue to monitor blood pressure.

## 2016-02-09 NOTE — Assessment & Plan Note (Signed)
Noted on review of lab work. She is taking iron. We will check a CBC.

## 2016-02-09 NOTE — Assessment & Plan Note (Signed)
Vision had a single episode of left leg swelling that resolved within 24 hours. No recurrence. Benign exam today. Discussed possible causes. Doubt DVT given lack of history and quick resolution. Could be venous insufficiency. Unlikely heart failure given unilateral swelling. Patient will continue to monitor. She is given return precautions.

## 2016-02-09 NOTE — Assessment & Plan Note (Signed)
Patient with a variety of reported autoimmune disorders. She does have evidence of scleroderma in her left lower arm and likely has Sjogren's given dry mouth and need to swallow with liquids. Given reported difficulty swallowing I advised GI evaluation, though she reports she has seen him previously and that they would do nothing for her so she declined. Given variety of autoimmune issues that she reports I discussed seeing a rheumatologist though she declined this as well. She would prefer to continue to monitor this at this time. She is given return precautions.

## 2016-02-09 NOTE — Assessment & Plan Note (Signed)
Patient reports palpitations associated with her anxiety. Does not occur at any other time other than her anxiety. Her EKG is reassuring. We will check lab work as outlined below. She will continue to monitor. If they are recurrent or occur at other times other than when she is anxious we will have her evaluated by cardiology. Given return precautions.

## 2016-02-10 ENCOUNTER — Encounter: Payer: Self-pay | Admitting: Surgical

## 2016-02-16 ENCOUNTER — Telehealth: Payer: Self-pay | Admitting: Family Medicine

## 2016-02-16 NOTE — Telephone Encounter (Signed)
Labs mailed to pt

## 2016-02-16 NOTE — Telephone Encounter (Signed)
Pt called wanting to have a copy of her lab work mailed to her. Please and thank you! [address] [place] [id number]

## 2016-02-21 ENCOUNTER — Other Ambulatory Visit: Payer: Self-pay | Admitting: *Deleted

## 2016-02-21 ENCOUNTER — Telehealth: Payer: Self-pay | Admitting: *Deleted

## 2016-02-21 NOTE — Telephone Encounter (Signed)
Patient requested a mediation refill for dipyridamole [facility] [address]

## 2016-02-21 NOTE — Telephone Encounter (Signed)
Spoke with Patient, this was filled by Dr. Rivka SaferBraunstein (??) prior.  Dosage is 50mg  three times a day.  Please advise refill. Thanks

## 2016-02-22 ENCOUNTER — Telehealth: Payer: Self-pay | Admitting: *Deleted

## 2016-02-22 MED ORDER — DIPYRIDAMOLE 50 MG PO TABS: 50.0000 mg | ORAL_TABLET | Freq: Three times a day (TID) | ORAL | Status: DC

## 2016-02-22 NOTE — Telephone Encounter (Signed)
Please advise, thanks.

## 2016-02-22 NOTE — Telephone Encounter (Signed)
Spoke with patient and she is not needing any RX refilled at this time. She was wanting to know if she could tell the pharmacy to send refill request to us in the future. I advised her that she should call the pharmacy and let them know that Dr. Birdie SonsSonnenberg is her PCP and to fax refill request to our office. If patient has any problems she will contact us.

## 2016-02-22 NOTE — Telephone Encounter (Signed)
Patient wanted to make sure, that Dr.Sonnenberg can refill all of he medication, and that he will notify the drug store, when she need refills.  Pt Contact [phone]

## 2016-02-22 NOTE — Telephone Encounter (Signed)
Please determine what medications she needs refills on. Thanks.

## 2016-02-22 NOTE — Telephone Encounter (Signed)
Will refill medication.

## 2016-03-14 ENCOUNTER — Ambulatory Visit: Payer: Medicare Other | Admitting: Family Medicine

## 2016-04-11 ENCOUNTER — Ambulatory Visit: Payer: Medicare Other | Admitting: Family Medicine

## 2016-08-23 ENCOUNTER — Telehealth: Payer: Self-pay

## 2016-08-23 NOTE — Telephone Encounter (Signed)
Needs follow up in regards to medication per Dr De NurseSonneberg.

## 2016-08-31 NOTE — Telephone Encounter (Signed)
He has been removed as the PCP.

## 2016-08-31 NOTE — Telephone Encounter (Signed)
Spoke with patient to schedule a follow up appointment. Patient stated that she is not a patient here that she sees [facility] and that she only came here one time as a second opinion. Advised Dr. Birdie SonsSonnenberg of this he said to take him off as PCP.

## 2017-10-23 ENCOUNTER — Encounter: Payer: Self-pay | Admitting: Psychiatry

## 2017-10-23 ENCOUNTER — Ambulatory Visit (INDEPENDENT_AMBULATORY_CARE_PROVIDER_SITE_OTHER): Payer: Medicare Other | Admitting: Psychiatry

## 2017-10-23 ENCOUNTER — Other Ambulatory Visit: Payer: Self-pay

## 2017-10-23 VITALS — BP 161/82 | HR 76 | Temp 98.1°F

## 2017-10-23 DIAGNOSIS — F1320 Sedative, hypnotic or anxiolytic dependence, uncomplicated: Secondary | ICD-10-CM

## 2017-10-23 DIAGNOSIS — F410 Panic disorder [episodic paroxysmal anxiety] without agoraphobia: Secondary | ICD-10-CM | POA: Diagnosis not present

## 2017-10-23 DIAGNOSIS — F411 Generalized anxiety disorder: Secondary | ICD-10-CM | POA: Diagnosis not present

## 2017-10-23 MED ORDER — ALPRAZOLAM 1 MG PO TABS: 1.0000 mg | ORAL_TABLET | Freq: Two times a day (BID) | ORAL | 0 refills | Status: DC | PRN

## 2017-10-23 NOTE — Patient Instructions (Signed)
PLEASE TAPER OFF XANAX. PLEASE USE XANAX ONLY FOR SEVERE PANIC ATTACKS.  THE GOAL WOULD BE TO TAPER IT OFF GRADUALLY GIVEN YOUR AGE AS WELL AS RISK ASSOCIATED WITH BENZODIAZEPINE MEDICATIONS.
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
## 2017-11-27 ENCOUNTER — Other Ambulatory Visit: Payer: Self-pay

## 2017-11-27 ENCOUNTER — Encounter: Payer: Self-pay | Admitting: Psychiatry

## 2017-11-27 ENCOUNTER — Ambulatory Visit: Payer: Medicare Other | Admitting: Psychiatry

## 2017-11-27 VITALS — BP 170/81 | HR 88 | Temp 97.7°F | Wt 135.4 lb

## 2017-11-27 DIAGNOSIS — F411 Generalized anxiety disorder: Secondary | ICD-10-CM | POA: Diagnosis not present

## 2017-11-27 DIAGNOSIS — F1320 Sedative, hypnotic or anxiolytic dependence, uncomplicated: Secondary | ICD-10-CM | POA: Diagnosis not present

## 2017-11-27 MED ORDER — ALPRAZOLAM 1 MG PO TABS: 1.0000 mg | ORAL_TABLET | Freq: Two times a day (BID) | ORAL | 2 refills | Status: DC | PRN

## 2017-11-27 NOTE — Progress Notes (Signed)
[facility] [facility] OP Progress Note  11/28/2017 9:27 AM Andrea Shah  MRN:  [id number]  Chief Complaint: ' I am ok." Chief Complaint    Follow-up; Medication Refill     HPI: Andrea Shah is an 84 year old Caucasian female, retired, has a history of anxiety disorder, as well as multiple medical problems who presented to the clinic today for a follow-up visit.  Andrea Shah today reports that she continues to take her Xanax 1 mg twice a day she has been doing so since the past several years.  She reports that she has several problems that causes balance issues and because of these balance issues she  gets extremely anxious.  She reports that whenever she has this balance issues she goes into a panic mode.  She needs to touch something or hold onto an object to help her walk again because of her anxiety symptoms.  She reports that when she takes a Xanax 1 mg she is able to function and she has been on it since the past several years and has been doing well.  Discussed with patient about the adverse effects of being on benzodiazepine medications for long-term.  The patient however is adamant that there is nothing else that will work for her.  She reports she has adverse effect to all kind of medications in general.  Her daughter also voices  Concern that writer may start a medication which may cause her mother serious side effects and may be fatal and hence does not want to start any medication that can cause serious side effects.  She continues to be on doxepin.  She reports she has been taking it since the past 20 years or so.  She reports it was started for her depression.  Discussed with the patient about getting an EKG to monitor her cardiac health.  Discussed with her to reach out to her PMD Dr. Fenton Malling.  Patient as well as daughter argumentative about what Probation officer discussed. Daughter became upset during the session stating that the patient is now going to worry about her cardiac health since writer asked her to get an EKG  to monitor . Provided medication education , reassured patient as well as daughter .   Pt reports that she struggles with multiple medical issues.  She is blind in one eye which affects her depth perception, she has scleroderma , has chronic pain which also causes her distress.  She reports she continues to take Ambien as needed for her sleep.  She reports her primary medical doctor has been prescribing her Ambien and she uses it as needed since he gives her only 21 pills for a month.  Denies any other concerns.  She reports she is not willing to try another medication yet since she is worried about the side effects.  She reports she was tried on Prozac in the past but she does not know whether she wants to try it yet.      Visit Diagnosis:    ICD-10-CM   1. Benzodiazepine dependence ([place]) F13.20   2. GAD (generalized anxiety disorder) F41.1 ALPRAZolam (XANAX) 1 MG tablet    Past Psychiatric History: Reports history of anxiety and panic symptoms.  Denies past history of inpatient admission for mental health problems.  Patient denies suicide attempts .Past trials of doxepin, Ambien, Prozac, xanax.  Past Medical History:  Past Medical History:  Diagnosis Date  . Arthritis   . Depression   . Diabetes ([place])   . GERD (gastroesophageal reflux disease)   .  High cholesterol   . Hypertension   . Stress incontinence   . Stroke (cerebrum) ([place])   . Thyroid dysfunction   . UTI (lower urinary tract infection)     Past Surgical History:  Procedure Laterality Date  . ABDOMINAL HYSTERECTOMY  1977  . APPENDECTOMY  1979  . BACK SURGERY      Family Psychiatric History:Grand son - anxiety  Family History:Autoimmune  Disease runs in her family. Family History  Problem Relation Age of Onset  . Arthritis Father   . Heart disease Father   . Breast cancer Sister   . Heart disease Mother   . High blood pressure Mother   . Diabetes Mother   . Sudden death Other 35       Nephew, aneurysm  .  Diabetes Brother    Substance abuse History: She is dependent on benzodiazepines.  She has been on benzodiazepines since the past several years.  She reports she does not abuse them and takes it only as prescribed and does not want to come off of it.  Social History: She is Widowed.  Married twice, divorced x1.  She lives in [place].  She has support from HER-2 daughters Social History   Socioeconomic History  . Marital status: Married    Spouse name: None  . Number of children: None  . Years of education: None  . Highest education level: None  Social Needs  . Financial resource strain: Not hard at all  . Food insecurity - worry: Never true  . Food insecurity - inability: Never true  . Transportation needs - medical: No  . Transportation needs - non-medical: No  Occupational History    Comment: RETIRED  Tobacco Use  . Smoking status: Former Smoker    Last attempt to quit: 10/24/1987    Years since quitting: 30.1  . Smokeless tobacco: Never Used  Substance and Sexual Activity  . Alcohol use: No    Alcohol/week: 0.0 oz  . Drug use: No  . Sexual activity: Not Currently  Other Topics Concern  . None  Social History Narrative  . None    Allergies:  Allergies  Allergen Reactions  . Percocet [Oxycodone-Acetaminophen] Anaphylaxis  . Aspirin     Does not take secondary to GI upset  . Levaquin [Levofloxacin In D5w]   . Penicillins Swelling  . Sulfa Antibiotics     With childhood reaction of a rash  . Vicodin [Hydrocodone-Acetaminophen] Anxiety    Metabolic Disorder Labs: No results found for: HGBA1C, MPG No results found for: PROLACTIN No results found for: CHOL, TRIG, HDL, CHOLHDL, VLDL, LDLCALC No results found for: TSH  Therapeutic Level Labs: No results found for: LITHIUM No results found for: VALPROATE No components found for:  CBMZ  Current Medications: Current Outpatient Medications  Medication Sig Dispense Refill  . acetaminophen (TYLENOL) 500 MG tablet Take  by mouth.    . ALPRAZolam (XANAX) 1 MG tablet Take 1 tablet (1 mg total) by mouth 2 (two) times daily as needed for anxiety. 60 tablet 2  . dipyridamole (PERSANTINE) 50 MG tablet Take 1 tablet (50 mg total) by mouth 3 (three) times daily. 90 tablet 1  . doxepin (SINEQUAN) 50 MG capsule     . glucose blood test strip     . hydrochlorothiazide (HYDRODIURIL) 25 MG tablet Take 1 tablet (25 mg total) by mouth daily. 90 tablet 3  . levothyroxine (SYNTHROID, LEVOTHROID) 100 MCG tablet Take by mouth.    . metFORMIN (GLUCOPHAGE-XR) 500  MG 24 hr tablet     . Multiple Vitamin (MULTIVITAMIN) tablet Take by mouth.    Marland Kitchen omeprazole (PRILOSEC) 10 MG capsule Take by mouth.    . vitamin B-12 (CYANOCOBALAMIN) 100 MCG tablet Take by mouth.    . zolpidem (AMBIEN) 10 MG tablet      No current facility-administered medications for this visit.      Musculoskeletal: Strength & Muscle Tone: within normal limits Gait & Station: normal Patient leans: N/A  Psychiatric Specialty Exam: Review of Systems  Psychiatric/Behavioral: The patient is nervous/anxious.   All other systems reviewed and are negative.   Blood pressure (!) 170/81, pulse 88, temperature 97.7 F (36.5 C), temperature source Oral, weight 135 lb 6.4 oz (61.4 kg).Body mass index is 23.06 kg/m.  General Appearance: Casual  Eye Contact:  Fair  Speech:  Normal Rate  Volume:  Normal  Mood:  Anxious  Affect:  Congruent  Thought Process:  Goal Directed and Descriptions of Associations: Intact  Orientation:  Full (Time, Place, and Person)  Thought Content: Logical   Suicidal Thoughts:  No  Homicidal Thoughts:  No  Memory:  Immediate;   Fair Recent;   Fair Remote;   Fair  Judgement:  Fair  Insight:  Fair  Psychomotor Activity:  Normal  Concentration:  Concentration: Fair and Attention Span: Fair  Recall:  [employer] of Knowledge: Fair  Language: Fair  Akathisia:  No  Handed:  Right  AIMS (if indicated):NA  Assets:  Communication  Skills Desire for Improvement Social Support  ADL's:  Intact  Cognition: WNL  Sleep:  Fair   Screenings:   Assessment and Plan: Andrea Shah is an 84 year old Caucasian female who has a history of anxiety symptoms as well as multiple medical problems including scleroderma, blindness in one eye, impaired depth perception as well as balance problems.  Patient is currently on Xanax which was initiated by her primary medical doctor and continued by Probation officer.This is patient's second appointment with Probation officer.  Patient does not want to taper of Xanax and is adamant that she wants to stay on the same dose.  She is afraid she may go into a panic mode or feel like she is going to die when she has the severe anxiety attacks and  reports that Xanax is the only medication that works.  Patient's daughter also adamant that that is the only medication that helps her. Patient continues to not want any other changes made to help with her anxiety sx . She wants to stay on Doxepin and does not want any changes .  Discussed plan as noted below.   Plan For anxiety disorder Continue Xanax as prescribed for now. Discussed benzodiazepine risk.  Discussed with her to try cutting her medication in to  a half or skipping a dose some days.  However patient or daughter does not seem to be receptive.  Both patient as well as daughter believes that this is the only medication that works for her.  When Probation officer discussed medication changes with patient as well as daughter, daughter made a comment that she does not want to try anything that may cause her bad side effects or kill her. Reviewed [place] controlled substance database  Pt is also on doxepin 100 mg prescribed by her primary medical doctor.  Based on previous records that were available to writer history of cardiac first-degree block, unknown if this is significant.  Patient as well as daughter reports she does not have any cardiac issues.  Discussed  with patient to get an EKG from her  primary medical doctor and monitor closely while she stays on this particular medication.  Also discussed with her that if the doxepin needs to be changed to an SSRI , this can be done so.  However patient is anxious to make changes at this time.  Follow-up in 3 months or sooner if needed.  More than 50 % of the time was spent for psychoeducation and supportive psychotherapy and care coordination.  This note was generated in part or whole with voice recognition software. Voice recognition is usually quite accurate but there are transcription errors that can and very often do occur. I apologize for any typographical errors that were not detected and corrected.        Ursula Alert, MD 11/28/2017, 9:27 AM

## 2017-11-28 ENCOUNTER — Encounter: Payer: Self-pay | Admitting: Psychiatry

## 2018-01-01 DIAGNOSIS — E039 Hypothyroidism, unspecified: Secondary | ICD-10-CM | POA: Diagnosis present

## 2018-02-26 ENCOUNTER — Ambulatory Visit: Payer: Medicare Other | Admitting: Psychiatry

## 2018-03-09 ENCOUNTER — Other Ambulatory Visit: Payer: Self-pay | Admitting: Psychiatry

## 2018-03-09 DIAGNOSIS — F411 Generalized anxiety disorder: Secondary | ICD-10-CM

## 2018-04-30 ENCOUNTER — Other Ambulatory Visit: Payer: Self-pay | Admitting: Psychiatry

## 2018-04-30 DIAGNOSIS — F411 Generalized anxiety disorder: Secondary | ICD-10-CM

## 2019-07-23 ENCOUNTER — Other Ambulatory Visit: Payer: Self-pay | Admitting: Pulmonary Disease

## 2019-07-23 DIAGNOSIS — J189 Pneumonia, unspecified organism: Secondary | ICD-10-CM

## 2019-07-29 ENCOUNTER — Other Ambulatory Visit: Payer: Self-pay

## 2019-07-29 ENCOUNTER — Ambulatory Visit
Admission: RE | Admit: 2019-07-29 | Discharge: 2019-07-29 | Disposition: A | Discharge status: Home / Self Care | Payer: Medicare Other | Source: Ambulatory Visit | Attending: Pulmonary Disease | Admitting: Pulmonary Disease

## 2019-07-29 ENCOUNTER — Ambulatory Visit: Admission: RE | Admit: 2019-07-29 | Payer: Medicare Other | Source: Ambulatory Visit

## 2019-07-29 DIAGNOSIS — J189 Pneumonia, unspecified organism: Secondary | ICD-10-CM

## 2020-01-22 IMAGING — CT CT CHEST W/O CM
2 of 3 series · 15 of 36 positions shown, 18 images · non-contrast
Comparison: Chest radiograph December 05, 2012

CLINICAL DATA: History of pneumonia.

EXAM:
CT CHEST WITHOUT CONTRAST
TECHNIQUE: Multidetector CT imaging of the chest was performed following the
standard protocol without IV contrast.

[Series 2: thorax · axial · 0.58mm/px · z∈[+823,+1077]mm · 12 of 149 slices shown, 15 images]
[im 11/149  mediastinal]
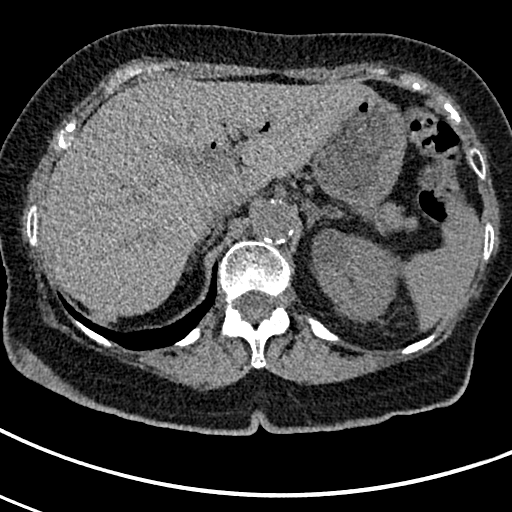
[im 11/149  lung]
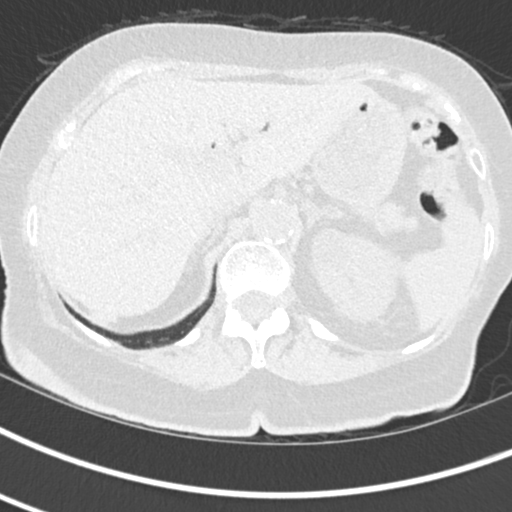
[im 22/149  lung]
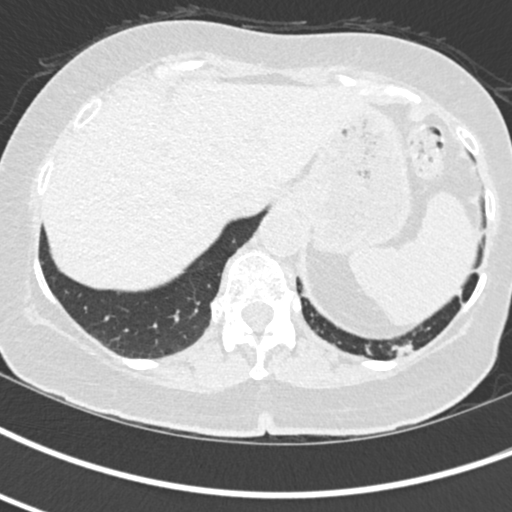
[im 33/149  lung]
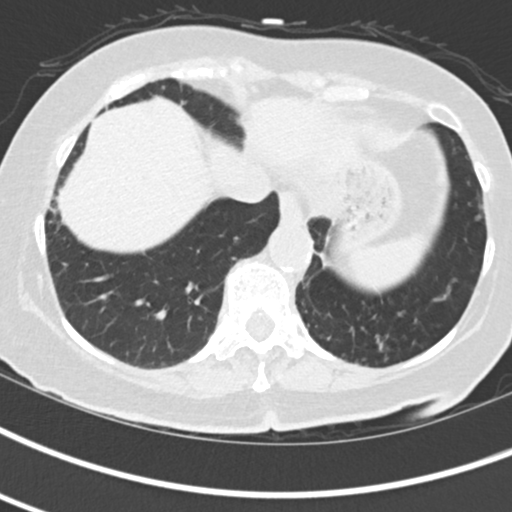
[im 44/149  lung]
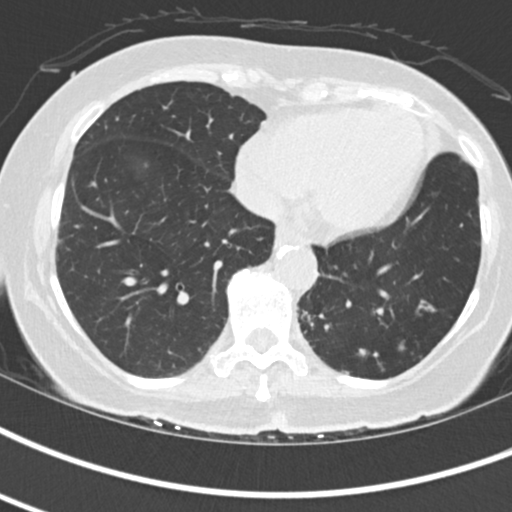
[im 55/149  mediastinal]
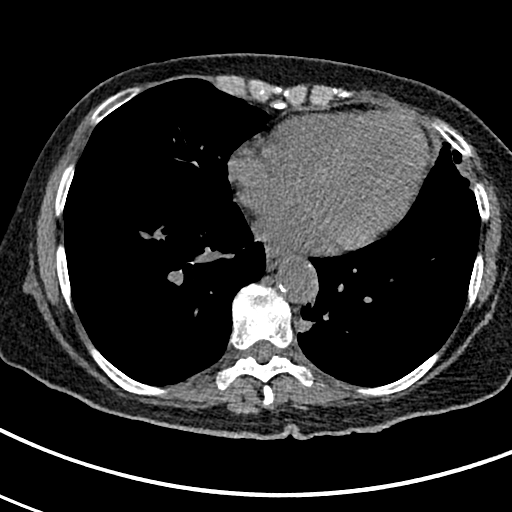
[im 55/149  lung]
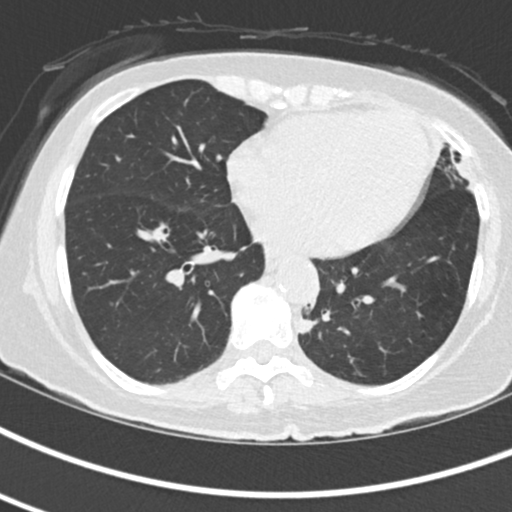
[im 66/149  lung]
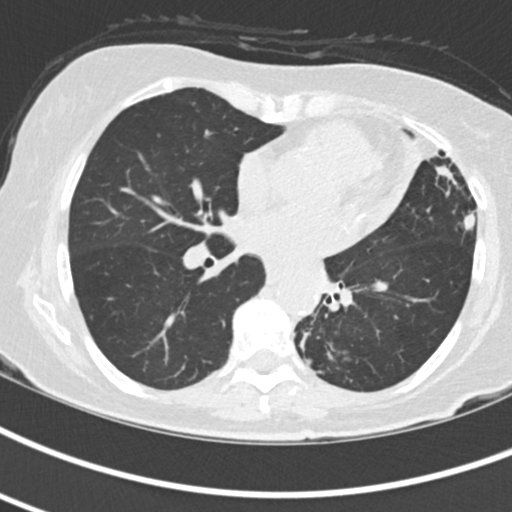
[im 83/149  lung]
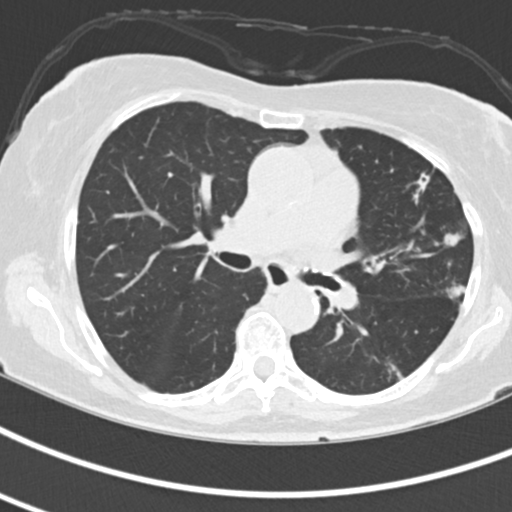
[im 94/149  lung]
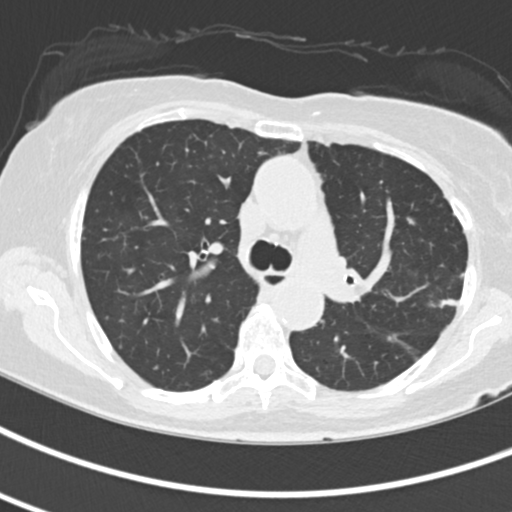
[im 105/149  mediastinal]
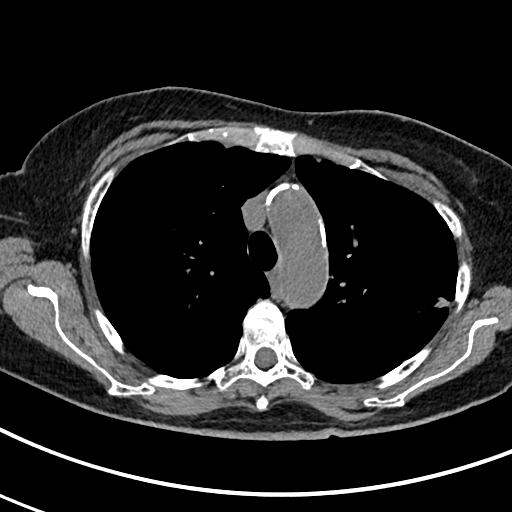
[im 105/149  lung]
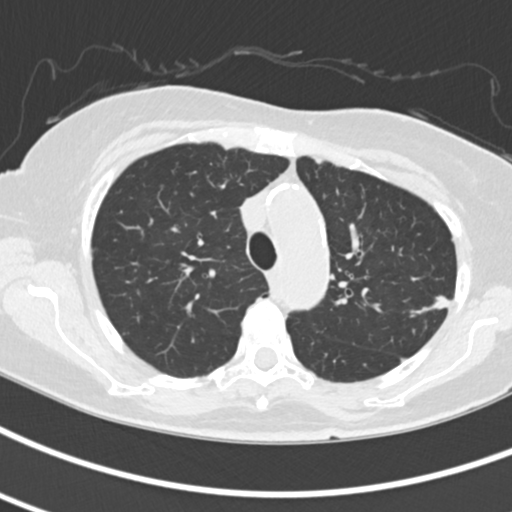
[im 116/149  lung]
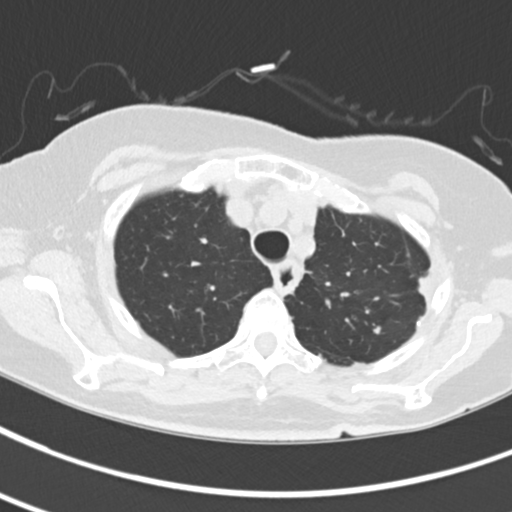
[im 127/149  lung]
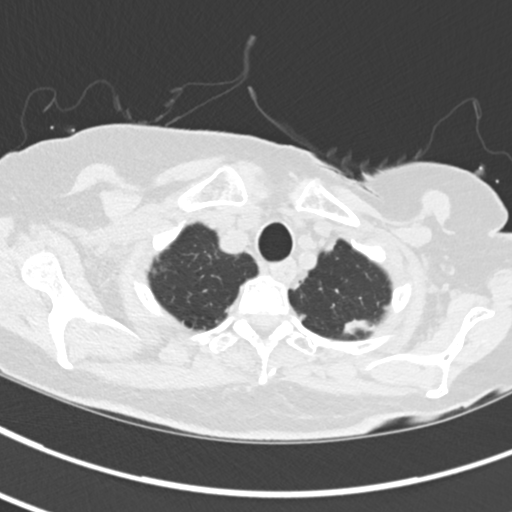
[im 138/149  lung]
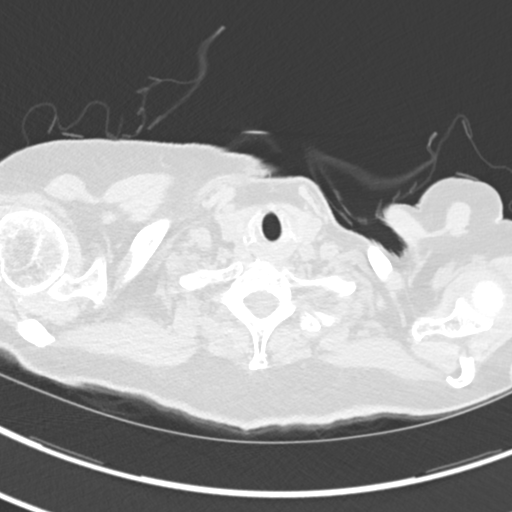

[Series 5: coronal · coronal · 0.59mm/px · 3 of 114 slices shown]
[im 23/114  lung]
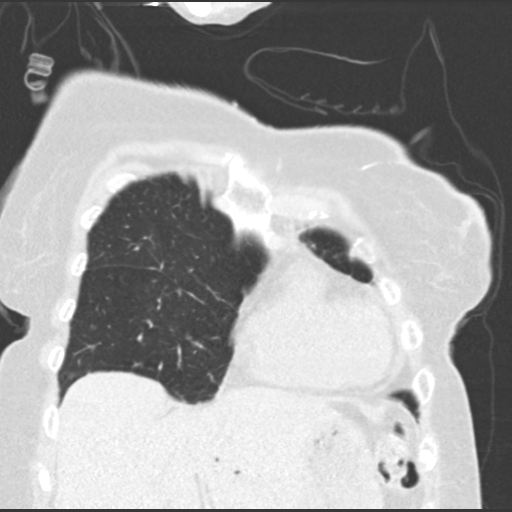
[im 46/114  lung]
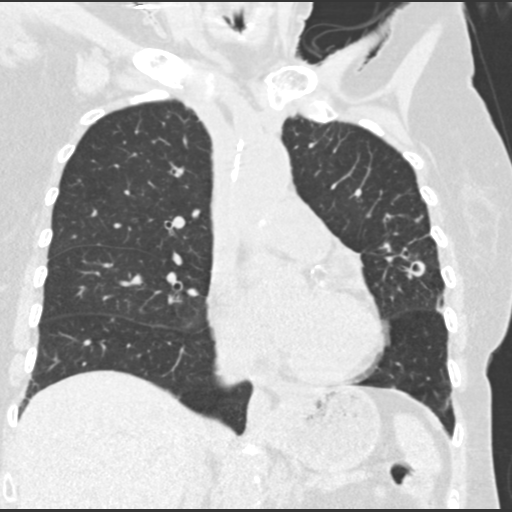
[im 68/114  lung]
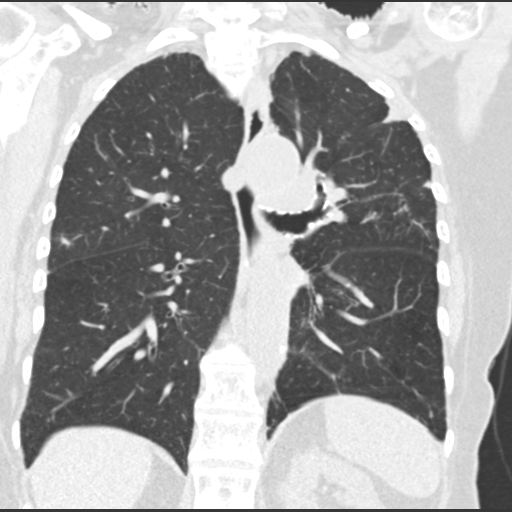

[15 of 36 positions shown; findings below may reference images not displayed]

FINDINGS: Cardiovascular: Normal heart size. Minimal pericardial effusion.
Calcific atherosclerotic disease of the coronary arteries and aorta.

Mediastinum/Nodes: No evidence of mediastinal lymphadenopathy. Focal
mucosal thickening of the proximal esophagus. Normal appearance of
the trachea.

Lungs/Pleura: Numerous peribronchovascular and subpleural soft
tissue nodules, some demonstrating internal cavitations. The process
is most pronounced in the lingula although such nodules could be
seen in the left lower lobe, left upper lobe, and occasionally in
the right lower lobe and right middle lobe. Additional smaller
ground-glass centrilobular nodules are seen throughout both lungs.
Subpleural thickening and architectural distortion in the left upper
lobe with some interspersed calcifications.

Upper Abdomen: No acute abnormality. Benign-appearing left renal
cyst.

Musculoskeletal: No chest wall mass or suspicious bone lesions
identified.
IMPRESSION: 1. Numerous peribronchovascular and subpleural soft tissue nodules,
some demonstrating internal cavitations. The process is most
pronounced in the lingula although such nodules could be seen in the
left lower lobe, left upper lobe, and occasionally in the right
lower lobe and right middle lobe. Differential diagnosis includes
infectious/inflammatory etiologies such as fungal or mycobacterial
infection, septic emboli or cavitary metastatic nodules.
2. Focal mucosal thickening of the proximal esophagus, which may
represent focal esophagitis. Malignancy however cannot be excluded.
3. Calcific atherosclerotic disease of the coronary arteries and
aorta.
4. Minimal pericardial effusion.
5. Benign-appearing left renal cyst.

Aortic Atherosclerosis (H2SUP-HUX.X).

## 2020-09-07 DIAGNOSIS — D500 Iron deficiency anemia secondary to blood loss (chronic): Secondary | ICD-10-CM | POA: Diagnosis present

## 2020-10-20 ENCOUNTER — Other Ambulatory Visit: Payer: Self-pay | Admitting: Internal Medicine

## 2020-10-20 DIAGNOSIS — R1011 Right upper quadrant pain: Secondary | ICD-10-CM

## 2020-10-20 DIAGNOSIS — R634 Abnormal weight loss: Secondary | ICD-10-CM

## 2020-10-25 ENCOUNTER — Other Ambulatory Visit: Payer: Self-pay

## 2020-10-25 ENCOUNTER — Ambulatory Visit
Admission: RE | Admit: 2020-10-25 | Discharge: 2020-10-25 | Disposition: A | Discharge status: Home / Self Care | Payer: Medicare Other | Source: Ambulatory Visit | Attending: Internal Medicine | Admitting: Internal Medicine

## 2020-10-25 DIAGNOSIS — R634 Abnormal weight loss: Secondary | ICD-10-CM | POA: Insufficient documentation

## 2020-10-25 DIAGNOSIS — R1011 Right upper quadrant pain: Secondary | ICD-10-CM | POA: Diagnosis present

## 2021-03-15 DIAGNOSIS — N1832 Chronic kidney disease, stage 3b: Secondary | ICD-10-CM | POA: Diagnosis present

## 2021-04-20 IMAGING — CT CT ABD-PELV W/O CM
2 of 4 series · 15 of 46 positions shown, 17 images · non-contrast
Comparison: 12/05/2012 and overlapping portions of CT chest from
07/29/2019

CLINICAL DATA: Right upper quadrant abdominal pain. Weight loss.
Scleroderma.

EXAM:
CT ABDOMEN AND PELVIS WITHOUT CONTRAST
TECHNIQUE: Multidetector CT imaging of the abdomen and pelvis was performed
following the standard protocol without IV contrast.

[Series 2: axials routine abdomen pelvis without 5.00 · axial · non-contrast · 0.69mm/px · z∈[-1476,-1111]mm · 12 of 87 slices shown, 14 images]
[im 7/87  soft-tissue]
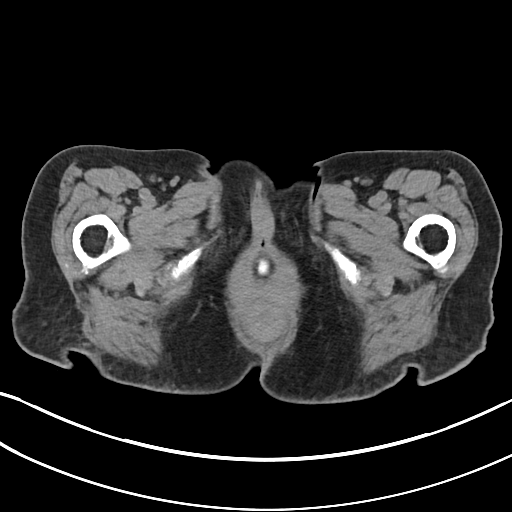
[im 7/87  bone]
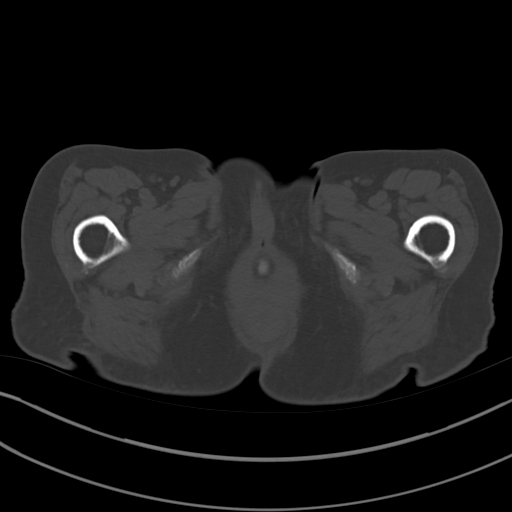
[im 14/87  soft-tissue]
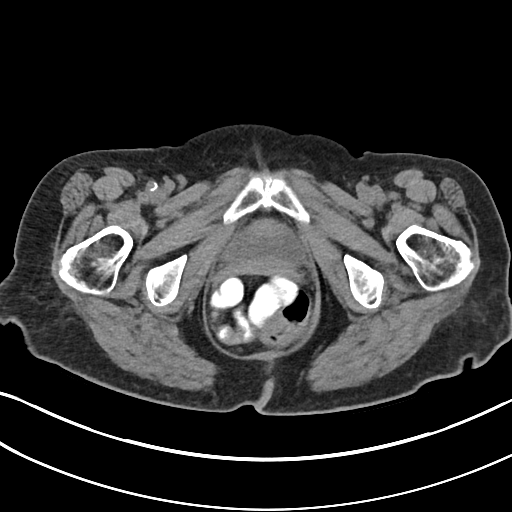
[im 20/87  soft-tissue]
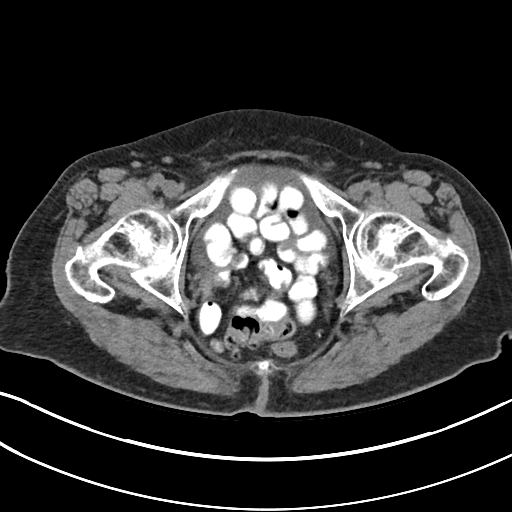
[im 27/87  soft-tissue]
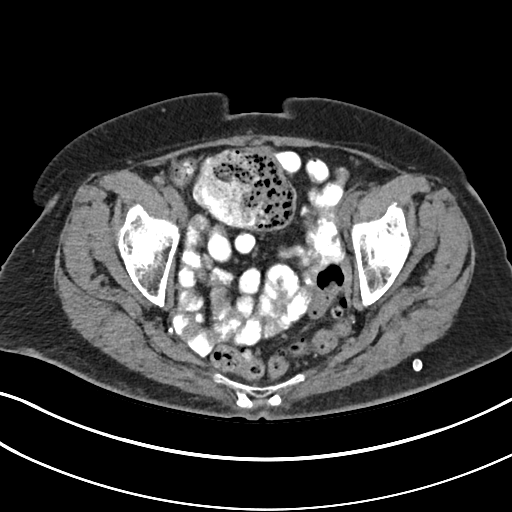
[im 34/87  soft-tissue]
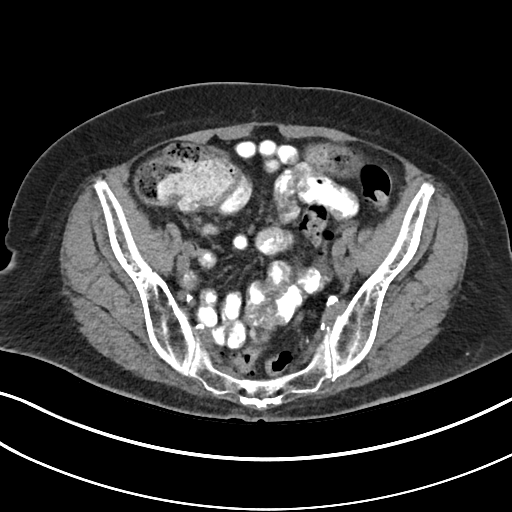
[im 40/87  soft-tissue]
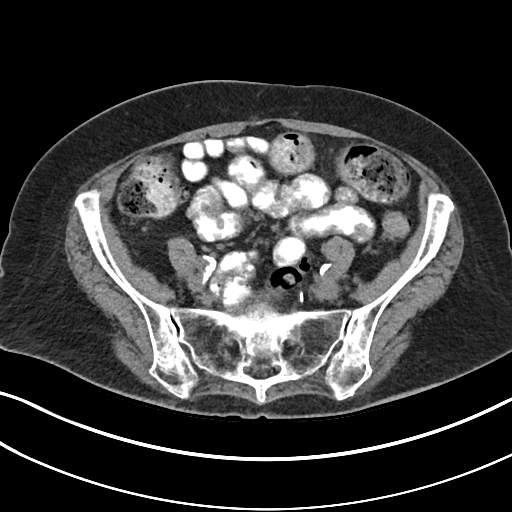
[im 47/87  soft-tissue]
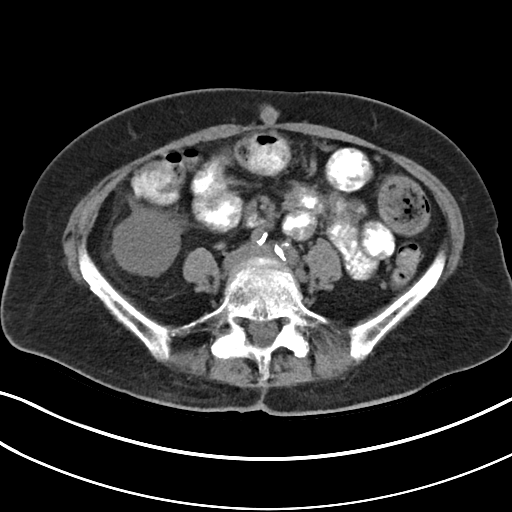
[im 53/87  soft-tissue]
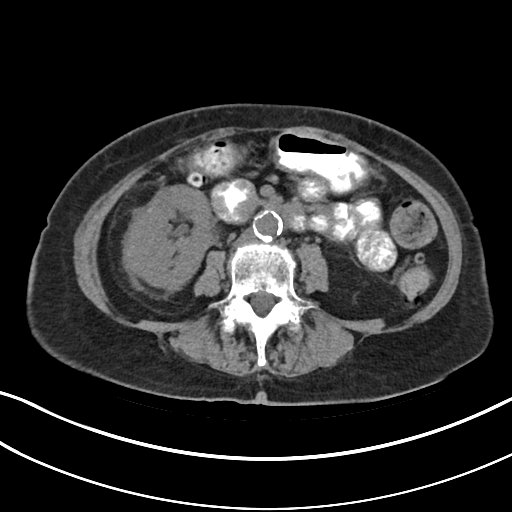
[im 60/87  soft-tissue]
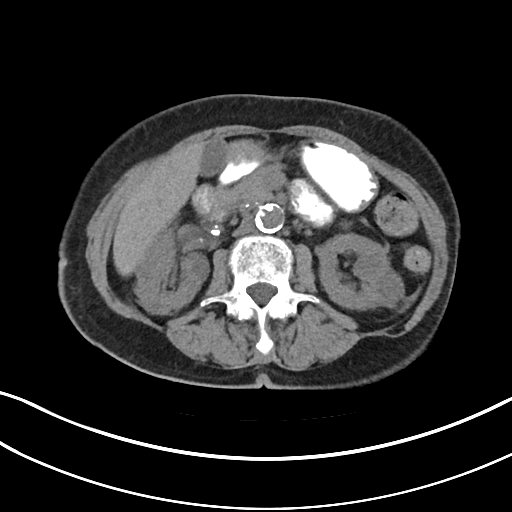
[im 60/87  bone]
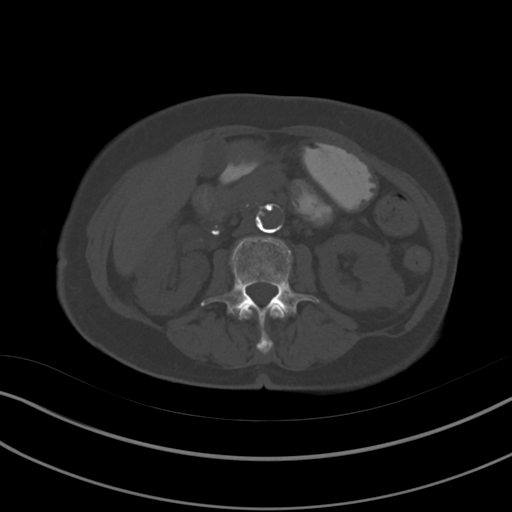
[im 67/87  soft-tissue]
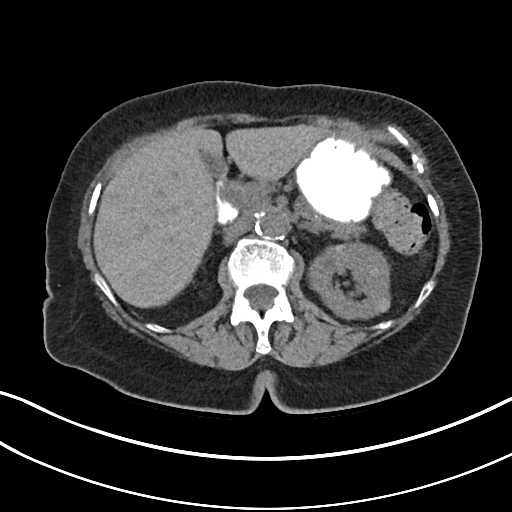
[im 73/87  soft-tissue]
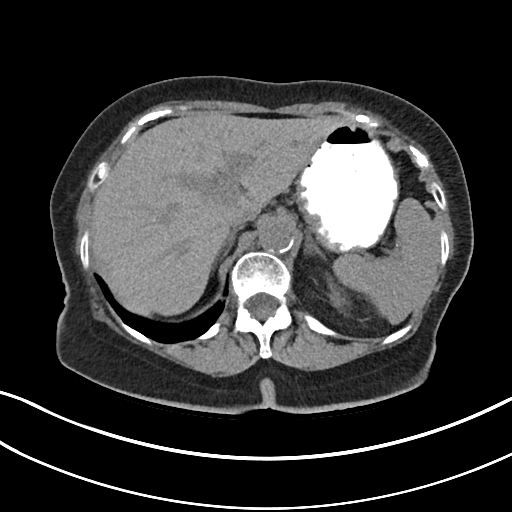
[im 80/87  soft-tissue]
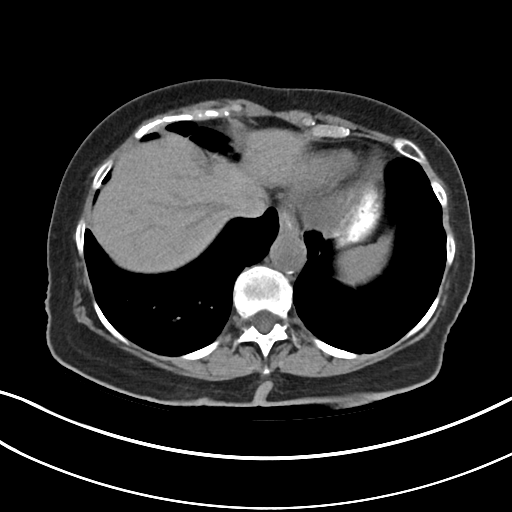

[Series 4: coronals routine abdomen pelvis without 2.00 cor · coronal · non-contrast · 0.69mm/px · 3 of 118 slices shown]
[im 40/118  soft-tissue]
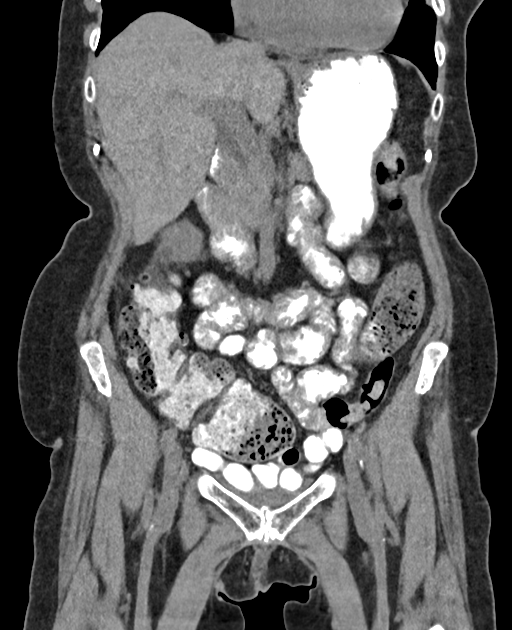
[im 53/118  soft-tissue]
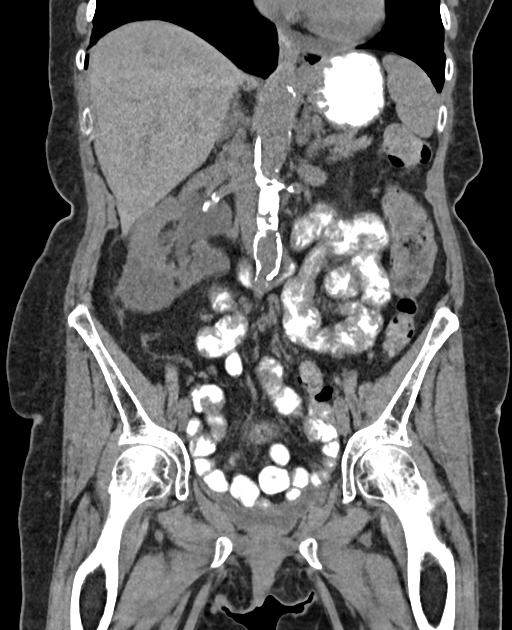
[im 66/118  soft-tissue]
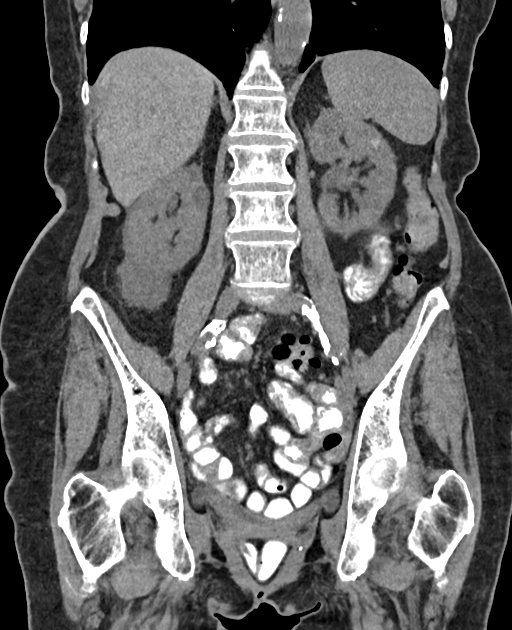

[15 of 46 positions shown; findings below may reference images not displayed]

FINDINGS: Lower chest: Left lower lobe bronchiectasis with tree-in-bud
nodularity similar to 07/29/2019 indicating airway plugging and
suspected atypical infectious bronchiolitis. This has an overall
similar pattern to 07/29/2019.

The small visualized portion of the lingula demonstrates some volume
loss and bronchiectasis on image 1 of series 3, no change from
07/29/2019.

Minimal scarring laterally in the right lower lobe.

Descending thoracic aortic atherosclerotic calcification.

Hepatobiliary: 1.4 cm gallstone in the gallbladder. Common bile duct
approximately 0.7 cm in diameter. Trace pneumobilia observed in the
left hepatic lobe, pneumobilia was also shown on the CT chest from
07/29/2019, correlate with any history of sphincterotomy.

Pancreas: Unremarkable

Spleen: Unremarkable

Adrenals/Urinary Tract: Both adrenal glands appear normal. Mostly
hypodense renal lesions are present favoring cysts, although several
renal lesions are hyperdense including a 1.0 cm left mid kidney
lesion on image 21 of series 2 and an adjacent 0.6 cm hyperdense
left renal lesion on image 20 of series 2. These likely represent a
combination of cysts of varying complexity, although IV contrast was
not administered and accordingly assessment for enhancement/solid
lesion cannot be performed.

Borderline right hydronephrosis although improved from 12/05/2012.
No hydroureter or stone identified.

Stomach/Bowel: Descending and sigmoid colon diverticulosis. No
active diverticulitis. Potential fold thickening in the duodenum and
jejunum, not considered highly specific for bowel manifestations of
scleroderma, but possibly representing a proximal enteritis.

Vascular/Lymphatic: Aortoiliac atherosclerotic vascular disease. No
pathologic adenopathy.

Reproductive: Uterus absent. Adnexa unremarkable.

Other: No supplemental non-categorized findings.

Musculoskeletal: Partially sacralized L5. Grade 1 degenerative
anterolisthesis of L4 on L5. Pelvic floor laxity with small
enterocele shown on image 93 of series 6.
IMPRESSION: 1. Potential fold thickening in the duodenum and jejunum, not
considered highly specific for bowel manifestations of scleroderma,
but possibly representing a proximal enteritis.
2. Other imaging findings of potential clinical significance: Left
lower lobe bronchiectasis with tree-in-bud nodularity similar to
07/29/2019 indicating airway plugging and suspected atypical
infectious bronchiolitis. Cholelithiasis. Trace pneumobilia in the
left hepatic lobe, also present on prior chest CT, correlate with
any history of sphincterotomy. Descending and sigmoid colon
diverticulosis. Pelvic floor laxity with small enterocele.
Borderline right hydronephrosis although improved from 12/05/2012.
3. Aortic atherosclerosis.

Aortic Atherosclerosis ([id number]-GB7.7).

## 2023-01-02 ENCOUNTER — Emergency Department: Payer: Medicare Other

## 2023-01-02 ENCOUNTER — Other Ambulatory Visit: Payer: Self-pay

## 2023-01-02 ENCOUNTER — Inpatient Hospital Stay
Admission: EM | Admit: 2023-01-02 | Discharge: 2023-01-07 | DRG: 304 | Disposition: A | Discharge status: Home Health | Payer: Medicare Other | Attending: Internal Medicine | Admitting: Internal Medicine

## 2023-01-02 DIAGNOSIS — D8989 Other specified disorders involving the immune mechanism, not elsewhere classified: Secondary | ICD-10-CM | POA: Diagnosis present

## 2023-01-02 DIAGNOSIS — I5033 Acute on chronic diastolic (congestive) heart failure: Secondary | ICD-10-CM | POA: Diagnosis present

## 2023-01-02 DIAGNOSIS — E7800 Pure hypercholesterolemia, unspecified: Secondary | ICD-10-CM | POA: Diagnosis present

## 2023-01-02 DIAGNOSIS — E1165 Type 2 diabetes mellitus with hyperglycemia: Secondary | ICD-10-CM | POA: Diagnosis present

## 2023-01-02 DIAGNOSIS — I161 Hypertensive emergency: Principal | ICD-10-CM | POA: Diagnosis present

## 2023-01-02 DIAGNOSIS — Z79890 Hormone replacement therapy (postmenopausal): Secondary | ICD-10-CM | POA: Diagnosis not present

## 2023-01-02 DIAGNOSIS — R4182 Altered mental status, unspecified: Secondary | ICD-10-CM | POA: Diagnosis not present

## 2023-01-02 DIAGNOSIS — I674 Hypertensive encephalopathy: Secondary | ICD-10-CM | POA: Diagnosis present

## 2023-01-02 DIAGNOSIS — F32A Depression, unspecified: Secondary | ICD-10-CM | POA: Diagnosis present

## 2023-01-02 DIAGNOSIS — N1832 Chronic kidney disease, stage 3b: Secondary | ICD-10-CM | POA: Diagnosis present

## 2023-01-02 DIAGNOSIS — U071 COVID-19: Secondary | ICD-10-CM | POA: Diagnosis present

## 2023-01-02 DIAGNOSIS — Z8701 Personal history of pneumonia (recurrent): Secondary | ICD-10-CM

## 2023-01-02 DIAGNOSIS — M25519 Pain in unspecified shoulder: Secondary | ICD-10-CM | POA: Diagnosis present

## 2023-01-02 DIAGNOSIS — E1122 Type 2 diabetes mellitus with diabetic chronic kidney disease: Secondary | ICD-10-CM | POA: Diagnosis present

## 2023-01-02 DIAGNOSIS — T465X6A Underdosing of other antihypertensive drugs, initial encounter: Secondary | ICD-10-CM | POA: Diagnosis present

## 2023-01-02 DIAGNOSIS — F8081 Childhood onset fluency disorder: Secondary | ICD-10-CM | POA: Diagnosis not present

## 2023-01-02 DIAGNOSIS — Z91199 Patient's noncompliance with other medical treatment and regimen due to unspecified reason: Secondary | ICD-10-CM

## 2023-01-02 DIAGNOSIS — I160 Hypertensive urgency: Secondary | ICD-10-CM | POA: Diagnosis present

## 2023-01-02 DIAGNOSIS — Z8261 Family history of arthritis: Secondary | ICD-10-CM

## 2023-01-02 DIAGNOSIS — I169 Hypertensive crisis, unspecified: Principal | ICD-10-CM

## 2023-01-02 DIAGNOSIS — I69320 Aphasia following cerebral infarction: Secondary | ICD-10-CM | POA: Diagnosis not present

## 2023-01-02 DIAGNOSIS — D500 Iron deficiency anemia secondary to blood loss (chronic): Secondary | ICD-10-CM | POA: Diagnosis present

## 2023-01-02 DIAGNOSIS — E119 Type 2 diabetes mellitus without complications: Secondary | ICD-10-CM | POA: Diagnosis present

## 2023-01-02 DIAGNOSIS — Z882 Allergy status to sulfonamides status: Secondary | ICD-10-CM

## 2023-01-02 DIAGNOSIS — F411 Generalized anxiety disorder: Secondary | ICD-10-CM

## 2023-01-02 DIAGNOSIS — R413 Other amnesia: Secondary | ICD-10-CM | POA: Diagnosis not present

## 2023-01-02 DIAGNOSIS — Z803 Family history of malignant neoplasm of breast: Secondary | ICD-10-CM

## 2023-01-02 DIAGNOSIS — M542 Cervicalgia: Secondary | ICD-10-CM | POA: Diagnosis present

## 2023-01-02 DIAGNOSIS — M349 Systemic sclerosis, unspecified: Secondary | ICD-10-CM | POA: Diagnosis present

## 2023-01-02 DIAGNOSIS — I130 Hypertensive heart and chronic kidney disease with heart failure and stage 1 through stage 4 chronic kidney disease, or unspecified chronic kidney disease: Secondary | ICD-10-CM | POA: Diagnosis present

## 2023-01-02 DIAGNOSIS — E039 Hypothyroidism, unspecified: Secondary | ICD-10-CM | POA: Diagnosis present

## 2023-01-02 DIAGNOSIS — Z881 Allergy status to other antibiotic agents status: Secondary | ICD-10-CM

## 2023-01-02 DIAGNOSIS — Z885 Allergy status to narcotic agent status: Secondary | ICD-10-CM

## 2023-01-02 DIAGNOSIS — G9349 Other encephalopathy: Secondary | ICD-10-CM | POA: Diagnosis not present

## 2023-01-02 DIAGNOSIS — Z7984 Long term (current) use of oral hypoglycemic drugs: Secondary | ICD-10-CM

## 2023-01-02 DIAGNOSIS — Z880 Allergy status to penicillin: Secondary | ICD-10-CM

## 2023-01-02 DIAGNOSIS — Z87891 Personal history of nicotine dependence: Secondary | ICD-10-CM

## 2023-01-02 DIAGNOSIS — G928 Other toxic encephalopathy: Secondary | ICD-10-CM | POA: Diagnosis present

## 2023-01-02 DIAGNOSIS — Z79899 Other long term (current) drug therapy: Secondary | ICD-10-CM

## 2023-01-02 DIAGNOSIS — R29818 Other symptoms and signs involving the nervous system: Secondary | ICD-10-CM

## 2023-01-02 DIAGNOSIS — R410 Disorientation, unspecified: Secondary | ICD-10-CM | POA: Diagnosis not present

## 2023-01-02 DIAGNOSIS — F419 Anxiety disorder, unspecified: Secondary | ICD-10-CM | POA: Diagnosis present

## 2023-01-02 DIAGNOSIS — Z886 Allergy status to analgesic agent status: Secondary | ICD-10-CM

## 2023-01-02 DIAGNOSIS — T424X5A Adverse effect of benzodiazepines, initial encounter: Secondary | ICD-10-CM | POA: Diagnosis present

## 2023-01-02 DIAGNOSIS — Z833 Family history of diabetes mellitus: Secondary | ICD-10-CM

## 2023-01-02 DIAGNOSIS — Z8249 Family history of ischemic heart disease and other diseases of the circulatory system: Secondary | ICD-10-CM

## 2023-01-02 DIAGNOSIS — A419 Sepsis, unspecified organism: Secondary | ICD-10-CM

## 2023-01-02 DIAGNOSIS — J470 Bronchiectasis with acute lower respiratory infection: Secondary | ICD-10-CM | POA: Diagnosis present

## 2023-01-02 DIAGNOSIS — J1282 Pneumonia due to coronavirus disease 2019: Secondary | ICD-10-CM | POA: Diagnosis present

## 2023-01-02 DIAGNOSIS — J189 Pneumonia, unspecified organism: Secondary | ICD-10-CM

## 2023-01-02 DIAGNOSIS — Z90710 Acquired absence of both cervix and uterus: Secondary | ICD-10-CM

## 2023-01-02 DIAGNOSIS — Z888 Allergy status to other drugs, medicaments and biological substances status: Secondary | ICD-10-CM

## 2023-01-02 DIAGNOSIS — G9341 Metabolic encephalopathy: Secondary | ICD-10-CM | POA: Diagnosis present

## 2023-01-02 DIAGNOSIS — I6389 Other cerebral infarction: Secondary | ICD-10-CM | POA: Diagnosis not present

## 2023-01-02 LAB — CBC WITH DIFFERENTIAL/PLATELET
Abs Immature Granulocytes: 0.08 10*3/uL — ABNORMAL HIGH (ref 0.00–0.07)
Basophils Absolute: 0 10*3/uL (ref 0.0–0.1)
Basophils Relative: 0 %
Eosinophils Absolute: 0.1 10*3/uL (ref 0.0–0.5)
Eosinophils Relative: 0 %
HCT: 32.1 % — ABNORMAL LOW (ref 36.0–46.0)
Hemoglobin: 10 g/dL — ABNORMAL LOW (ref 12.0–15.0)
Immature Granulocytes: 1 %
Lymphocytes Relative: 19 %
Lymphs Abs: 2.1 10*3/uL (ref 0.7–4.0)
MCH: 29.2 pg (ref 26.0–34.0)
MCHC: 31.2 g/dL (ref 30.0–36.0)
MCV: 93.6 fL (ref 80.0–100.0)
Monocytes Absolute: 0.8 10*3/uL (ref 0.1–1.0)
Monocytes Relative: 7 %
Neutro Abs: 8.3 10*3/uL — ABNORMAL HIGH (ref 1.7–7.7)
Neutrophils Relative %: 73 %
Platelets: 261 10*3/uL (ref 150–400)
RBC: 3.43 MIL/uL — ABNORMAL LOW (ref 3.87–5.11)
RDW: 14.6 % (ref 11.5–15.5)
WBC: 11.4 10*3/uL — ABNORMAL HIGH (ref 4.0–10.5)
nRBC: 0 % (ref 0.0–0.2)

## 2023-01-02 LAB — COMPREHENSIVE METABOLIC PANEL
ALT: 21 U/L (ref 0–44)
AST: 34 U/L (ref 15–41)
Albumin: 3.5 g/dL (ref 3.5–5.0)
Alkaline Phosphatase: 77 U/L (ref 38–126)
Anion gap: 13 (ref 5–15)
BUN: 32 mg/dL — ABNORMAL HIGH (ref 8–23)
CO2: 21 mmol/L — ABNORMAL LOW (ref 22–32)
Calcium: 9.3 mg/dL (ref 8.9–10.3)
Chloride: 105 mmol/L (ref 98–111)
Creatinine, Ser: 1.44 mg/dL — ABNORMAL HIGH (ref 0.44–1.00)
GFR, Estimated: 35 mL/min — ABNORMAL LOW (ref >60)
Glucose, Bld: 293 mg/dL — ABNORMAL HIGH (ref 70–99)
Potassium: 4.1 mmol/L (ref 3.5–5.1)
Sodium: 139 mmol/L (ref 135–145)
Total Bilirubin: 0.7 mg/dL (ref 0.3–1.2)
Total Protein: 7.3 g/dL (ref 6.5–8.1)

## 2023-01-02 LAB — TROPONIN I (HIGH SENSITIVITY): Troponin I (High Sensitivity): 23 ng/L — ABNORMAL HIGH (ref <18)

## 2023-01-02 LAB — T4, FREE: Free T4: 0.66 ng/dL (ref 0.61–1.12)

## 2023-01-02 LAB — TSH: TSH: 5.052 u[IU]/mL — ABNORMAL HIGH (ref 0.350–4.500)

## 2023-01-02 LAB — LACTIC ACID, PLASMA: Lactic Acid, Venous: 2.7 mmol/L (ref 0.5–1.9)

## 2023-01-02 MED ORDER — SODIUM CHLORIDE 0.9 % IV SOLN
1.0000 g | Freq: Once | INTRAVENOUS | Status: AC
  Administered 2023-01-03: 1 g via INTRAVENOUS
  Filled 2023-01-02: qty 10

## 2023-01-02 MED ORDER — LABETALOL HCL 5 MG/ML IV SOLN
20.0000 mg | Freq: Once | INTRAVENOUS | Status: AC
  Administered 2023-01-02: 20 mg via INTRAVENOUS
  Filled 2023-01-02: qty 4

## 2023-01-02 MED ORDER — LABETALOL HCL 5 MG/ML IV SOLN
10.0000 mg | Freq: Once | INTRAVENOUS | Status: AC
  Administered 2023-01-02: 10 mg via INTRAVENOUS
  Filled 2023-01-02: qty 4

## 2023-01-02 MED ORDER — SODIUM CHLORIDE 0.9 % IV BOLUS
1000.0000 mL | Freq: Once | INTRAVENOUS | Status: AC
  Administered 2023-01-02: 1000 mL via INTRAVENOUS

## 2023-01-02 MED ORDER — SODIUM CHLORIDE 0.9 % IV SOLN
500.0000 mg | Freq: Once | INTRAVENOUS | Status: DC
  Filled 2023-01-02: qty 5

## 2023-01-02 NOTE — ED Triage Notes (Addendum)
Pt arrived via EMS from home. Had covid and pneumonia a month ago. Pt family stated pt had high bp today, and was confused yesterday. BP220/112, BCG 212 en route to ED. Pt was found earlier today outside in her open bathrobe and underwear called daughter's name. Pt also had a fall over the weekend. Pt states she hit her head when she fell.

## 2023-01-02 NOTE — ED Provider Notes (Signed)
[facility] Provider Note    Event Date/Time   First MD Initiated Contact with Patient 01/02/23 2227     (approximate)   History   Hypertension and Altered Mental Status   HPI  Andrea Shah is a 89 y.o. female   Past medical history of autoimmune disease medical record of scleroderma, hypertension, upper lipidemia, thyroid dysfunction on Synthroid, prior stroke, depression, who takes doxepin and Xanax who presents to the emergency department with altered mental status and hypertension.  She is disoriented.  History was provided mostly by daughter who I spoke with over the phone.  The daughter states that she recently had COVID and pneumonia and was recovering after taking antibiotics.  Since then she has had intermittent altered mental status, intermittent stuttering speech and difficulty finding words that would self resolve.  This has been going on for the last several weeks.  It has been worsening.  On Friday of last week, she called her daughter who lives down the street to inform her of a fall she sustained because she could not make it to the bathroom on time and slipped on her wet clothes.  She was not medically evaluated at that time.  Since then, her periods of altered mental status and stuttering speech have worsened.  A visiting nurse took blood pressure today and it was markedly elevated.  This has been the most altered mother has been since these episodes started.  She has had no changes in her medications, but the daughter is unsure whether she has been taking her medications since the patient has been altered and is usually self administering her meds.  Independent Historian contributed to assessment above: Daughter  External Medical Documents Reviewed: Office visit dated 12/18/2022 for bilateral pneumonia on antibiotics      Physical Exam   Triage Vital Signs: ED Triage Vitals  Enc Vitals Group     BP 01/02/23 2221 (!) 252/116     Pulse  Rate 01/02/23 2221 (!) 106     Resp 01/02/23 2221 (!) 21     Temp 01/02/23 2221 98.3 F (36.8 C)     Temp Source 01/02/23 2221 Oral     SpO2 01/02/23 2221 97 %     Weight 01/02/23 2232 118 lb (53.5 kg)     Height 01/02/23 2232 5' 5"$  (1.651 m)     Head Circumference --      Peak Flow --      Pain Score 01/02/23 2223 0     Pain Loc --      Pain Edu? --      Excl. in [place]? --     Most recent vital signs: Vitals:   01/02/23 2230 01/02/23 2300  BP: (!) 249/111 (!) 228/101  Pulse: (!) 102 73  Resp: (!) 21   Temp:    SpO2: 97% 96%    General: Awake, no distress.  CV:  Good peripheral perfusion.  Resp:  Normal effort.  Abd:  No distention.  Other:  Awake alert following commands, stuttering speech, disoriented, moves all extremities sensory intact no facial asymmetry dysarthria.  Does have some expressive aphasia.   ED Results / Procedures / Treatments   Labs (all labs ordered are listed, but only abnormal results are displayed) Labs Reviewed  COMPREHENSIVE METABOLIC PANEL - Abnormal; Notable for the following components:      Result Value   CO2 21 (*)    Glucose, Bld 293 (*)    BUN 32 (*)  Creatinine, Ser 1.44 (*)    GFR, Estimated 35 (*)    All other components within normal limits  LACTIC ACID, PLASMA - Abnormal; Notable for the following components:   Lactic Acid, Venous 2.7 (*)    All other components within normal limits  CBC WITH DIFFERENTIAL/PLATELET - Abnormal; Notable for the following components:   WBC 11.4 (*)    RBC 3.43 (*)    Hemoglobin 10.0 (*)    HCT 32.1 (*)    Neutro Abs 8.3 (*)    Abs Immature Granulocytes 0.08 (*)    All other components within normal limits  TROPONIN I (HIGH SENSITIVITY) - Abnormal; Notable for the following components:   Troponin I (High Sensitivity) 23 (*)    All other components within normal limits  RESP PANEL BY RT-PCR (RSV, FLU A&B, COVID)  [id number]  LACTIC ACID, PLASMA  URINALYSIS, ROUTINE W REFLEX MICROSCOPIC  TSH   T4, FREE     I ordered and reviewed the above labs they are notable for white blood cell count is elevated 11.4.  EKG  ED ECG REPORT I, Lucillie Garfinkel, the attending physician, personally viewed and interpreted this ECG.   Date: 01/02/2023  EKG Time: 2222  Rate: 106  Rhythm: sinus tachy  Axis: nl  Intervals: L ant Fascicular block  ST&T Change: No acute ischemic changes    RADIOLOGY I independently reviewed and interpreted x-ray and see diffuse patchiness concerning for pneumonia   PROCEDURES:  Critical Care performed: Yes, see critical care procedure note(s)  .Critical Care  Performed by: Lucillie Garfinkel, MD Authorized by: Lucillie Garfinkel, MD   Critical care provider statement:    Critical care time (minutes):  30   Critical care was time spent personally by me on the following activities:  Development of treatment plan with patient or surrogate, discussions with consultants, evaluation of patient's response to treatment, examination of patient, ordering and review of laboratory studies, ordering and review of radiographic studies, ordering and performing treatments and interventions, pulse oximetry, re-evaluation of patient's condition and review of old [facility]: Medications  sodium chloride 0.9 % bolus 1,000 mL (has no administration in time range)  cefTRIAXone (ROCEPHIN) 1 g in sodium chloride 0.9 % 100 mL IVPB (has no administration in time range)  azithromycin (ZITHROMAX) 500 mg in sodium chloride 0.9 % 250 mL IVPB (has no administration in time range)  labetalol (NORMODYNE) injection 20 mg (has no administration in time range)  labetalol (NORMODYNE) injection 10 mg (10 mg Intravenous Given 01/02/23 2244)     IMPRESSION / MDM / [place] / ED COURSE  I reviewed the triage vital signs and the nursing notes.                                Patient's presentation is most consistent with acute presentation with potential threat to  life or bodily function.  Differential diagnosis includes, but is not limited to, hypertensive emergency, intracranial bleeding, thyroid dysfunction, benzodiazepine withdrawal, scleroderma renal crisis   The patient is on the cardiac monitor to evaluate for evidence of arrhythmia and/or significant heart rate changes.  MDM: Patient got 10 of IV labetalol with some improvement in her blood pressure from [id number] systolic.  I ordered another 20 mg of IV labetalol.  X-ray shows a consolidation in the lung concerning for pneumonia, history of the same, I gave CAP coverage and fluids,  lactic acidosis and white blood cell count. CT of the head was negative.  Admit for hypertensive crisis and community-acquired pneumonia.  MR brain for r/o stroke given expressive aphasia, outside of window for thrombylytic.          FINAL CLINICAL IMPRESSION(S) / ED DIAGNOSES   Final diagnoses:  Hypertensive crisis  Altered mental status, unspecified altered mental status type  Community acquired pneumonia, unspecified laterality     Rx / DC Orders   ED Discharge Orders     None        Note:  This document was prepared using Dragon voice recognition software and may include unintentional dictation errors.    Lucillie Garfinkel, MD 01/02/23 [phone]

## 2023-01-02 NOTE — ED Notes (Signed)
CRITICAL VALUE STICKER  CRITICAL VALUE: 2.7  RECEIVER (on-site recipient of call): Guadelupe Sabin, [place] NOTIFIED: 01/02/23 2316   MD NOTIFIED: Dr. Jacelyn Grip   TIME OF NOTIFICATION: 2329

## 2023-01-03 ENCOUNTER — Inpatient Hospital Stay: Payer: Medicare Other

## 2023-01-03 ENCOUNTER — Encounter: Payer: Self-pay | Admitting: Internal Medicine

## 2023-01-03 DIAGNOSIS — R413 Other amnesia: Secondary | ICD-10-CM

## 2023-01-03 DIAGNOSIS — U071 COVID-19: Secondary | ICD-10-CM | POA: Diagnosis not present

## 2023-01-03 DIAGNOSIS — R410 Disorientation, unspecified: Secondary | ICD-10-CM

## 2023-01-03 DIAGNOSIS — E1165 Type 2 diabetes mellitus with hyperglycemia: Secondary | ICD-10-CM

## 2023-01-03 DIAGNOSIS — I160 Hypertensive urgency: Secondary | ICD-10-CM | POA: Insufficient documentation

## 2023-01-03 DIAGNOSIS — A419 Sepsis, unspecified organism: Secondary | ICD-10-CM

## 2023-01-03 DIAGNOSIS — F8081 Childhood onset fluency disorder: Secondary | ICD-10-CM

## 2023-01-03 DIAGNOSIS — G9349 Other encephalopathy: Secondary | ICD-10-CM

## 2023-01-03 DIAGNOSIS — I161 Hypertensive emergency: Secondary | ICD-10-CM

## 2023-01-03 DIAGNOSIS — E119 Type 2 diabetes mellitus without complications: Secondary | ICD-10-CM | POA: Diagnosis present

## 2023-01-03 DIAGNOSIS — G9341 Metabolic encephalopathy: Secondary | ICD-10-CM

## 2023-01-03 LAB — VITAMIN B12: Vitamin B-12: 494 pg/mL (ref 180–914)

## 2023-01-03 LAB — URINALYSIS, ROUTINE W REFLEX MICROSCOPIC
Bacteria, UA: NONE SEEN
Bilirubin Urine: NEGATIVE
Glucose, UA: 150 mg/dL — AB
Hgb urine dipstick: NEGATIVE
Ketones, ur: NEGATIVE mg/dL
Leukocytes,Ua: NEGATIVE
Nitrite: NEGATIVE
Protein, ur: >=300 mg/dL — AB
Specific Gravity, Urine: 1.009 (ref 1.005–1.030)
pH: 6 (ref 5.0–8.0)

## 2023-01-03 LAB — RESP PANEL BY RT-PCR (RSV, FLU A&B, COVID)  RVPGX2
Influenza A by PCR: NEGATIVE
Influenza B by PCR: NEGATIVE
Resp Syncytial Virus by PCR: NEGATIVE
SARS Coronavirus 2 by RT PCR: POSITIVE — AB

## 2023-01-03 LAB — TROPONIN I (HIGH SENSITIVITY): Troponin I (High Sensitivity): 31 ng/L — ABNORMAL HIGH (ref <18)

## 2023-01-03 LAB — PROCALCITONIN: Procalcitonin: <0.1 ng/mL

## 2023-01-03 LAB — LACTIC ACID, PLASMA: Lactic Acid, Venous: 1.1 mmol/L (ref 0.5–1.9)

## 2023-01-03 LAB — BRAIN NATRIURETIC PEPTIDE: B Natriuretic Peptide: 471.7 pg/mL — ABNORMAL HIGH (ref 0.0–100.0)

## 2023-01-03 LAB — AMMONIA: Ammonia: 19 umol/L (ref 9–35)

## 2023-01-03 LAB — LIPID PANEL
Cholesterol: 195 mg/dL (ref 0–200)
HDL: 60 mg/dL (ref >40)
LDL Cholesterol: 116 mg/dL — ABNORMAL HIGH (ref 0–99)
Total CHOL/HDL Ratio: 3.3 RATIO
Triglycerides: 95 mg/dL (ref <150)
VLDL: 19 mg/dL (ref 0–40)

## 2023-01-03 LAB — C-REACTIVE PROTEIN: CRP: 1 mg/dL — ABNORMAL HIGH (ref <1.0)

## 2023-01-03 LAB — HIV ANTIBODY (ROUTINE TESTING W REFLEX): HIV Screen 4th Generation wRfx: NONREACTIVE

## 2023-01-03 LAB — HEMOGLOBIN A1C
Hgb A1c MFr Bld: 6.7 % — ABNORMAL HIGH (ref 4.8–5.6)
Mean Plasma Glucose: 145.59 mg/dL

## 2023-01-03 MED ORDER — ENOXAPARIN SODIUM 30 MG/0.3ML IJ SOSY
30.0000 mg | PREFILLED_SYRINGE | INTRAMUSCULAR | Status: DC
  Administered 2023-01-03 – 2023-01-04 (×2): 30 mg via SUBCUTANEOUS
  Filled 2023-01-03 (×2): qty 0.3

## 2023-01-03 MED ORDER — ACETAMINOPHEN 325 MG PO TABS
650.0000 mg | ORAL_TABLET | ORAL | Status: DC | PRN
  Administered 2023-01-04 – 2023-01-06 (×6): 650 mg via ORAL
  Filled 2023-01-03 (×7): qty 2

## 2023-01-03 MED ORDER — ALPRAZOLAM 0.5 MG PO TABS
1.0000 mg | ORAL_TABLET | Freq: Two times a day (BID) | ORAL | Status: DC | PRN
  Administered 2023-01-03 – 2023-01-06 (×6): 1 mg via ORAL
  Filled 2023-01-03 (×7): qty 2

## 2023-01-03 MED ORDER — ACETAMINOPHEN 160 MG/5ML PO SOLN: 650.0000 mg | ORAL | Status: DC | PRN

## 2023-01-03 MED ORDER — SODIUM CHLORIDE 0.9 % IV SOLN: 2.0000 g | INTRAVENOUS | Status: DC

## 2023-01-03 MED ORDER — ACETAMINOPHEN 650 MG RE SUPP: 650.0000 mg | RECTAL | Status: DC | PRN

## 2023-01-03 MED ORDER — SODIUM CHLORIDE 0.9 % IV SOLN
500.0000 mg | INTRAVENOUS | Status: DC
  Administered 2023-01-03: 500 mg via INTRAVENOUS

## 2023-01-03 MED ORDER — LEVOTHYROXINE SODIUM 100 MCG PO TABS
100.0000 ug | ORAL_TABLET | Freq: Every day | ORAL | Status: DC
  Administered 2023-01-03 – 2023-01-07 (×5): 100 ug via ORAL
  Filled 2023-01-03: qty 2
  Filled 2023-01-03: qty 1
  Filled 2023-01-03: qty 2
  Filled 2023-01-03 (×2): qty 1

## 2023-01-03 MED ORDER — ISOSORBIDE MONONITRATE ER 30 MG PO TB24
30.0000 mg | ORAL_TABLET | Freq: Every day | ORAL | Status: DC
  Administered 2023-01-03 – 2023-01-07 (×5): 30 mg via ORAL
  Filled 2023-01-03 (×5): qty 1

## 2023-01-03 MED ORDER — SODIUM CHLORIDE 0.9 % IV SOLN
1.0000 g | Freq: Once | INTRAVENOUS | Status: AC
  Administered 2023-01-03: 1 g via INTRAVENOUS
  Filled 2023-01-03: qty 10

## 2023-01-03 MED ORDER — SODIUM CHLORIDE 0.9 % IV SOLN: INTRAVENOUS | Status: DC

## 2023-01-03 MED ORDER — NICARDIPINE HCL IN NACL 20-0.86 MG/200ML-% IV SOLN
3.0000 mg/h | INTRAVENOUS | Status: DC
  Administered 2023-01-03: 3 mg/h via INTRAVENOUS
  Administered 2023-01-03: 5 mg/h via INTRAVENOUS
  Administered 2023-01-03: 9 mg/h via INTRAVENOUS
  Administered 2023-01-03: 7.5 mg/h via INTRAVENOUS
  Administered 2023-01-03: 5 mg/h via INTRAVENOUS
  Administered 2023-01-03 – 2023-01-04 (×2): 10 mg/h via INTRAVENOUS
  Filled 2023-01-03 (×7): qty 200

## 2023-01-03 MED ORDER — DOXEPIN HCL 50 MG PO CAPS
50.0000 mg | ORAL_CAPSULE | Freq: Every day | ORAL | Status: DC
  Administered 2023-01-03 – 2023-01-05 (×3): 50 mg via ORAL
  Filled 2023-01-03 (×5): qty 1

## 2023-01-03 MED ORDER — HYDRALAZINE HCL 50 MG PO TABS
100.0000 mg | ORAL_TABLET | Freq: Three times a day (TID) | ORAL | Status: DC
  Administered 2023-01-03 – 2023-01-07 (×11): 100 mg via ORAL
  Filled 2023-01-03 (×11): qty 2

## 2023-01-03 MED ORDER — STROKE: EARLY STAGES OF RECOVERY BOOK: Freq: Once | Status: AC

## 2023-01-03 NOTE — Progress Notes (Signed)
   Follow Up Note  HPI: 89 year old female with past medical history of hypertension and hypothyroidism and scleroderma who presented to the emergency room on the night of 2/14 with confusion that had been progressing over the last 2 weeks.  Patient's baseline is alert and oriented x 3 and appropriate.  Patient's daughter noted that over the past few weeks she has had trouble talking and on day of presentation, she had gone into the garage looking for her other daughter who was not there.  In the emergency room, lab work positive for [place].  Initially thought to have CVA however CVA ruled out.  Patient mid to the hospitalist service earlier this morning.  Blood pressure is markedly elevated in the 200s.    Exam: CV: Regular rate and rhythm, S1-S2 Lungs: Clear to auscultation bilaterally Abd: Soft, nontender, nondistended, positive bowel sounds Ext: No clubbing or cyanosis or edema Neuro: Unequivocal.  No focal deficits  Principal Problem:   Hypertensive emergency without congestive heart failure: Checking echocardiogram.  According to patient's daughter, she had stopped taking her blood pressure medications about a month ago when she was diagnosed with pneumonia because she is worried about dehydration and balance.  This would explain why her pressures have been difficult to control currently on Cardene drip.  Adding p.o. medications to try to improve this. Active Problems:  COVID infection: Procalcitonin normal.  Will stop antibiotics.  Will clarify if she does indeed have sepsis.   Sepsis ([place])   Uncontrolled type 2 diabetes mellitus with hyperglycemia, without long-term current use of insulin ([place])   Acute metabolic encephalopathy: Unclear etiology.  Discussed with neurology who is seeing patient.  MRI and CT rule out CVA.  Checking EEG.  Could this be secondary to markedly high blood pressures.?

## 2023-01-03 NOTE — Assessment & Plan Note (Signed)
At baseline 

## 2023-01-03 NOTE — Assessment & Plan Note (Addendum)
BP 252/116 on admission.  Initially placed on Cardene drip.  This is likely secondary to noncompliance for the last month patient's blood pressure medications.  Able to be weaned off of drip.  Additional medications including metoprolol, hydralazine and Imdur added.  Blood pressure now in the 160s and should improve further with diuresis.  There may be some component of mild heart failure.  Echo notes grade 1 diastolic dysfunction.  Of note, patient's mentation and speech issues are better today as blood pressure is better controlled.

## 2023-01-03 NOTE — ED Notes (Signed)
Pts daughter called RN into room because she wants staff to help her mom into a hospital bed from the recliner. When Probation officer and assisting RN walked into room, daughter was tearful. She was encouraged to share her feelings and express her concerns. "I just want my mom back, this isn't her". Pts daughter expressed that she feels like staff is not doing anything and is upset that the pt is not upstairs in a room. A hospital bed was brought into room and replaced her stretcher. Pt safely placed into hospital bed to make her more comfortable. Charge RN also aware of pts daughters concerns about plan of care. Pts daughter reassured that staff is doing everything they can to help her and that she will get a room upstairs once a room becomes available.

## 2023-01-03 NOTE — ED Notes (Signed)
Pt resting comfortably in chair. Daughter at bedside. Vitals stable at this time. Pt is mentally at baseline. Cont. Cardiac monitor. Call light within reach if the pt needs anything

## 2023-01-03 NOTE — Evaluation (Signed)
Occupational Therapy Evaluation Patient Details Name: Andrea Shah MRN: [id number] DOB: April 24, 1933 Today's Date: 01/03/2023   History of Present Illness 89 y.o. female with medical history significant for Hypothyroidism, hypertension, HLD, autoimmune disease/scleroderma, type 2 diabetes, prior stroke with sensory ataxia, recently treated for Covid with pneumonia with doxycycline completed about 2 weeks prior.   Clinical Impression   Patient presenting with decreased Ind in self care,balance, functional mobility/transfers, endurance, and safety awareness. Patient's daughter present in room during assessment to confirm baseline. Pt with expressive aphasia and word finding difficulties. Attempted only yes no questions but she still had a hard time with these. Pt's daughter reports she is Ind at baseline and very active. Family lives nearby but unsure how much assistance they can offer. Pt would need 24/7 for supervision secondary to cognition and speech.  Pt needing +2 assistance to stand and take a few steps to recliner chair with pt being very fearful/anxious. Pt needing cuing for sequencing of routine self care tasks as well.   Patient will benefit from acute OT to increase overall independence in the areas of ADLs, functional mobility, and safety awareness in order to safely discharge to next venue of care.      Recommendations for follow up therapy are one component of a multi-disciplinary discharge planning process, led by the attending physician.  Recommendations may be updated based on patient status, additional functional criteria and insurance authorization.   Follow Up Recommendations  Skilled nursing-short term rehab (<3 hours/day)     Assistance Recommended at Discharge Frequent or constant Supervision/Assistance  Patient can return home with the following A lot of help with walking and/or transfers;A lot of help with bathing/dressing/bathroom;Help with stairs or ramp for  entrance;Assist for transportation;Assistance with cooking/housework    Functional Status Assessment  Patient has had a recent decline in their functional status and demonstrates the ability to make significant improvements in function in a reasonable and predictable amount of time.  Equipment Recommendations  Other (comment) (Use of RW at home)       Precautions / Restrictions Precautions Precautions: None Restrictions Weight Bearing Restrictions: No      Mobility Bed Mobility Overal bed mobility: Needs Assistance Bed Mobility: Supine to Sit     Supine to sit: Min assist          Transfers Overall transfer level: Needs assistance Equipment used: 2 person hand held assist Transfers: Sit to/from Stand, Bed to chair/wheelchair/BSC Sit to Stand: Mod assist     Step pivot transfers: +2 physical assistance, +2 safety/equipment, Min assist, Mod assist            Balance Overall balance assessment: Needs assistance Sitting-balance support: Bilateral upper extremity supported Sitting balance-Leahy Scale: Fair Sitting balance - Comments: able to maintain sitting balance relatively confidently   Standing balance support: Bilateral upper extremity supported Standing balance-Leahy Scale: Zero                             ADL either performed or assessed with clinical judgement   ADL Overall ADL's : Needs assistance/impaired                                       General ADL Comments: Pt is very fearful of mobility and transfers requiring +2 assistance for safety. She was able to don B socks while seated on edge  of stretcher with cuing to sequence(take old sock off first and then put on new sock) and min guard for balance with use of figure four.     Vision Baseline Vision/History: 2 Legally blind (L eye blindness) Patient Visual Report: No change from baseline              Pertinent Vitals/Pain Pain Assessment Pain Assessment:  Faces Faces Pain Scale: No hurt     Hand Dominance Right   Extremity/Trunk Assessment Upper Extremity Assessment Upper Extremity Assessment: Generalized weakness   Lower Extremity Assessment Lower Extremity Assessment: Generalized weakness       Communication Communication Communication: No difficulties   Cognition Arousal/Alertness: Awake/alert Behavior During Therapy: Anxious Overall Cognitive Status: Impaired/Different from baseline                                 General Comments: Pt with word finding difficulties and daughter reports cognition is very different. Pt having difficulties with yes and no questions as well.     General Comments  Pt has been much weaker than her baseline for the last month, family has been helping much more than normal - hope to take her home (not rehab)            [place] expects to be discharged to:: Unsure Living Arrangements: Alone Available Help at Discharge: Family;Available PRN/intermittently                         Home Equipment: Rolling Pettey (2 wheels)          Prior Functioning/Environment Prior Level of Function : Independent/Modified Independent             Mobility Comments: apparently pt is out of the home multiple times/week when the weather is good - uses [employer] vs [facility].  Recently struggling with basic in-home mobilty. ADLs Comments: Pt is very active and has friends over multiple times a week to play bridge for ~ 5 hours. self care performed without assistance and daughter lives a few doors down.        OT Problem List: Decreased strength;Decreased cognition;Decreased activity tolerance;Decreased safety awareness;Impaired balance (sitting and/or standing);Decreased knowledge of use of DME or AE      OT Treatment/Interventions: Self-care/ADL training;Therapeutic exercise;Therapeutic activities;DME and/or AE instruction;Patient/family education;Balance  training;Energy conservation    OT Goals(Current goals can be found in the care plan section) Acute Rehab OT Goals Patient Stated Goal: to return to PLOF OT Goal Formulation: With patient Time For Goal Achievement: 01/17/23 Potential to Achieve Goals: Good ADL Goals Pt Will Perform Grooming: with supervision;standing Pt Will Perform Lower Body Dressing: with supervision;sit to/from stand Pt Will Transfer to Toilet: with supervision;ambulating Pt Will Perform Toileting - Clothing Manipulation and hygiene: with supervision;sit to/from stand  OT Frequency: Min 2X/week       AM-PAC OT "6 Clicks" Daily Activity     Outcome Measure Help from another person eating meals?: None Help from another person taking care of personal grooming?: A Little Help from another person toileting, which includes using toliet, bedpan, or urinal?: A Lot Help from another person bathing (including washing, rinsing, drying)?: A Lot Help from another person to put on and taking off regular upper body clothing?: A Little Help from another person to put on and taking off regular lower body clothing?: A Lot 6 Click Score: 16   End of Session Nurse  Communication: Mobility status  Activity Tolerance: Patient tolerated treatment well Patient left: in chair;with call bell/phone within reach;with family/visitor present  OT Visit Diagnosis: Unsteadiness on feet (R26.81);Repeated falls (R29.6);Muscle weakness (generalized) (M62.81);Cognitive communication deficit (R41.841)                Time: 1003-1030 OT Time Calculation (min): 27 min Charges:  OT General Charges $OT Visit: 1 Visit OT Evaluation $OT Eval Moderate Complexity: 1 Mod OT Treatments $Self Care/Home Management : 8-22 mins  Darleen Crocker, MS, OTR/L , CBIS ascom [phone]  01/03/23, 1:17 PM

## 2023-01-03 NOTE — ED Notes (Signed)
Per Dr. [place] Pink, new goal for SBP is 150. Cardene gtt titrated to 7.15m/ hr

## 2023-01-03 NOTE — ED Notes (Signed)
Transported to MRI

## 2023-01-03 NOTE — Assessment & Plan Note (Addendum)
Elevated TSH but normal free T4.  Elevated TSH likely in the setting of acute illness Continue levothyroxine

## 2023-01-03 NOTE — Assessment & Plan Note (Addendum)
Resumed home dose of Sinequan and Xanax.  This medication may be playing a role in some of her confusion.  Reportedly, she is post to be on 0.5 3 times daily as needed.  Would not tube patient in for MI, she takes 1 mg 3 times a day as needed.  She may be taking more than his usual and may have run out, with that causing some of her delirium

## 2023-01-03 NOTE — Assessment & Plan Note (Deleted)
Possible sepsis Patient was treated for pneumonia a couple weeks prior as well as COVID Chest x-ray showing bronchiectasis, chronic, atypical infectious process such as TB WBC 11,000 with lactic acid 2.7 and patient tachycardic and tachypneic Will treat with IV fluid per sepsis protocol Rocephin and azithromycin

## 2023-01-03 NOTE — Procedures (Signed)
Pt having ultrasound then in recliner eating-will try again in am

## 2023-01-03 NOTE — ED Notes (Signed)
Report received from Kelly, RN

## 2023-01-03 NOTE — Evaluation (Signed)
Physical Therapy Evaluation Patient Details Name: Andrea Shah MRN: [id number] DOB: August 30, 1933 Today's Date: 01/03/2023  History of Present Illness  89 y.o. female with medical history significant for Hypothyroidism, hypertension, HLD, autoimmune disease/scleroderma, type 2 diabetes, prior stroke with sensory ataxia, recently treated for Covid with pneumonia with doxycycline completed about 2 weeks prior.  Clinical Impression  Pt pleasantly confused t/o the session, per daughter she is alert and sharp at baseline but has not recently been herself.  Pt was able to do some bed mobility tasks but struggled with attempts at standing and ultimately did not get to fully upright despite a few attempts.  Pt and family very much hoping to take her home (not rehab) but understand that functionally and cognitively she is not currently safe to do so.  Will recommend STR at this point with the hope that her cognition/mobility improve enough to get back home safely.  Pt will benefit from continued PT to address her functional limitations and facilitate mobility and return to PLOF.       Recommendations for follow up therapy are one component of a multi-disciplinary discharge planning process, led by the attending physician.  Recommendations may be updated based on patient status, additional functional criteria and insurance authorization.  Follow Up Recommendations Skilled nursing-short term rehab (<3 hours/day) Can patient physically be transported by private vehicle: No    Assistance Recommended at Discharge Frequent or constant Supervision/Assistance  Patient can return home with the following  A lot of help with walking and/or transfers;A lot of help with bathing/dressing/bathroom;Assistance with cooking/housework;Help with stairs or ramp for entrance;Assist for transportation    Equipment Recommendations None recommended by PT  Recommendations for Other Services       Functional Status Assessment  Patient has had a recent decline in their functional status and demonstrates the ability to make significant improvements in function in a reasonable and predictable amount of time.     Precautions / Restrictions Precautions Precautions: None Restrictions Weight Bearing Restrictions: No      Mobility  Bed Mobility Overal bed mobility: Needs Assistance Bed Mobility: Supine to Sit, Sit to Supine     Supine to sit: Min assist Sit to supine: Min assist   General bed mobility comments: Pt showed willingness with getting to/from EOB but was guarded and hesitant and needing constant light assist to stay on task and encourage continued effort    Transfers   Equipment used: Rolling [place]wirth (2 wheels)               General transfer comment: Attempted with and w/o direct physical help, close assistance t/o.  With assist able to get hips off bed but was very anxious, did not shift weight forward and struggled to get LEs underneath her enough to feel confident/get to full standing... Ultimately says "I can't right now" and she requested to lay back down    Ambulation/Gait Ambulation/Gait assistance:  (deferred)                Stairs            Wheelchair Mobility    Modified Rankin (Stroke Patients Only)       Balance Overall balance assessment: Needs assistance Sitting-balance support: Bilateral upper extremity supported Sitting balance-Leahy Scale: Fair Sitting balance - Comments: able to maintain sitting balance relatively confidently   Standing balance support: Bilateral upper extremity supported Standing balance-Leahy Scale: Zero Standing balance comment: pt unable to assume upright posture despite assist, fearful and guarded  Pertinent Vitals/Pain      Home Living Family/patient expects to be discharged to:: Unsure Living Arrangements: Alone Available Help at Discharge: Family;Available PRN/intermittently              Home Equipment: Rolling Faiella (2 wheels) [id number])      Prior Function Prior Level of Function : Independent/Modified Independent             Mobility Comments: apparently pt is out of the home multiple times/week when the weather is good - uses [employer] vs [facility].  Recently struggling with basic in-home mobilty. ADLs Comments: until last month was having friends over ~4/week to play bridge, could dress and bathe w/o issue. daughter would stop by every morning with breakfast.  Recently needing a lot more assist     Hand Dominance        Extremity/Trunk Assessment   Upper Extremity Assessment Upper Extremity Assessment: Generalized weakness    Lower Extremity Assessment Lower Extremity Assessment: Generalized weakness       Communication   Communication: No difficulties  Cognition Arousal/Alertness: Awake/alert Behavior During Therapy: Anxious Overall Cognitive Status: Impaired/Different from baseline                                 General Comments: Pt oriented to self but confused about situation, unable to give much background/PLOF information.  Pleasantly confused        General Comments General comments (skin integrity, edema, etc.): Pt has been much weaker than her baseline for the last month, family has been helping much more than normal - hope to take her home (not rehab)    Exercises     Assessment/Plan    PT Assessment Patient needs continued PT services  PT Problem List Decreased strength;Decreased range of motion;Decreased activity tolerance;Decreased balance;Decreased mobility;Decreased cognition;Decreased knowledge of use of DME;Decreased safety awareness       PT Treatment Interventions DME instruction;Gait training;Stair training;Functional mobility training;Therapeutic activities;Therapeutic exercise;Balance training;Neuromuscular re-education;Cognitive remediation;Patient/family education    PT Goals (Current goals can be  found in the Care Plan section)  Acute Rehab PT Goals Patient Stated Goal: go home PT Goal Formulation: With patient Time For Goal Achievement: 01/16/23 Potential to Achieve Goals: Fair    Frequency Min 2X/week     Co-evaluation               AM-PAC PT "6 Clicks" Mobility  Outcome Measure Help needed turning from your back to your side while in a flat bed without using bedrails?: A Little Help needed moving from lying on your back to sitting on the side of a flat bed without using bedrails?: A Little Help needed moving to and from a bed to a chair (including a wheelchair)?: A Lot Help needed standing up from a chair using your arms (e.g., wheelchair or bedside chair)?: A Lot Help needed to walk in hospital room?: Total Help needed climbing 3-5 steps with a railing? : Total 6 Click Score: 12    End of Session Equipment Utilized During Treatment: Gait belt Activity Tolerance: Patient limited by fatigue Patient left: in bed;with call bell/phone within reach;with family/visitor present   PT Visit Diagnosis: Muscle weakness (generalized) (M62.81);Difficulty in walking, not elsewhere classified (R26.2);Unsteadiness on feet (R26.81)    Time: [id number] PT Time Calculation (min) (ACUTE ONLY): 28 min   Charges:   PT Evaluation $PT Eval Low Complexity: 1 Low PT Treatments $Therapeutic Activity: 8-22 mins  Kreg Shropshire, DPT 01/03/2023, 11:54 AM

## 2023-01-03 NOTE — H&P (Signed)
History and Physical    Patient: Andrea Shah [id number] DOB: [birth year] DOA: 01/02/2023 DOS: the patient was seen and examined on 01/03/2023 PCP: Rusty Aus, MD  Patient coming from: Home  Chief Complaint:  Chief Complaint  Patient presents with   Hypertension   Altered Mental Status    HPI: Andrea Shah is a 89 y.o. female with medical history significant for Hypothyroidism, hypertension, HLD, autoimmune disease/scleroderma, type 2 diabetes, prior stroke with sensory ataxia, recently treated for Covid with pneumonia with doxycycline completed about 2 weeks priorWho presents to the ED by EMS with concerns for confusion and elevated blood pressures.  Patient was apparently found outside in her open bathroom and underwear calling her daughter's name.  According to ED provider, daughter stated that she has been intermittently confused since being treated for pneumonia and has had episodes of dysarthria and getting her words out.  She fell about 5 days ago but suffered no injury.  She is intermittently very clear and her usual self but with episodic confusion.  Her respiratory symptoms seem to have improved. ED course and data review: On arrival BP 252/116 with pulse 106 and respirations 21.  WBC 11,400 with lactic acid 2.7.  Creatinine at baseline at 1.44.  Glucose 293.  Troponin 23.  TSH elevated at 5.052.  Free T4 WNL. EKG, personally viewed and interpreted shows sinus tachycardia at 106 with no acute ST-T wave changes. CT head was nonacute Chest x-ray showed persistent findings of bronchiectasis and other as follows: IMPRESSION: Bronchiectasis and bronchial wall thickening with patchy nodular infiltrates in the left mid lung. Changes likely represent chronic bronchiectasis with fluid-filled cystic bronchiectasis. Atypical infectious process such as TB or fungal infection or soft tissue nodules would be less likely considerations. Similar findings were present on prior CT from  07/29/2019.  Patient was treated with an IV bolus of NS and started on Rocephin and azithromycin for possible CAP.  She was also given IV labetalol for BP control, though BP remained elevated with systolic at [id number] at the time of admission. Hospitalist consulted for admission for hypertensive emergency, CAP and possible stroke outside tPA window given symptom onset a couple weeks prior.  MRI pending at the time of request for admission     Past Medical History:  Diagnosis Date   Arthritis    Depression    Diabetes ([place])    GERD (gastroesophageal reflux disease)    High cholesterol    Hypertension    Stress incontinence    Stroke (cerebrum) (HCC)    Thyroid dysfunction    UTI (lower urinary tract infection)    Past Surgical History:  Procedure Laterality Date   [place]     Social History:  reports that she quit smoking about 35 years ago. She has never used smokeless tobacco. She reports that she does not drink alcohol and does not use drugs.  Allergies  Allergen Reactions   Percocet [Oxycodone-Acetaminophen] Anaphylaxis   Aspirin     Does not take secondary to GI upset   Levaquin [Levofloxacin In D5w]    Penicillins Swelling   Sulfa Antibiotics     With childhood reaction of a rash   Vicodin [Hydrocodone-Acetaminophen] Anxiety    Family History  Problem Relation Age of Onset   Arthritis Father    Heart disease Father    Breast cancer Sister    Heart disease Mother    High blood  pressure Mother    Diabetes Mother    Sudden death Other 45       Nephew, aneurysm   Diabetes Brother     Prior to Admission medications   Medication Sig Start Date End Date Taking? Authorizing Provider  acetaminophen (TYLENOL) 500 MG tablet Take 500 mg by mouth every 6 (six) hours as needed.   Yes [provider]  ALPRAZolam Duanne Moron) 1 MG tablet Take 1 tablet (1 mg total) by mouth 2 (two) times daily as needed for anxiety.  11/27/17  Yes Ursula Alert, MD  doxepin (SINEQUAN) 50 MG capsule Take 50 mg by mouth at bedtime. 11/17/12  Yes [provider]  hydrochlorothiazide (HYDRODIURIL) 25 MG tablet Take 1 tablet (25 mg total) by mouth daily. 02/07/16  Yes Leone Haven, MD  levothyroxine (SYNTHROID, LEVOTHROID) 100 MCG tablet Take 100 mcg by mouth daily before breakfast.   Yes [provider]  meclizine (ANTIVERT) 12.5 MG tablet Take 12.5 mg by mouth. 10/27/22  Yes [provider]  Multiple Vitamin (MULTIVITAMIN) tablet Take by mouth.   Yes [provider]  omeprazole (PRILOSEC) 10 MG capsule Take by mouth.   Yes [provider]  vitamin B-12 (CYANOCOBALAMIN) 100 MCG tablet Take by mouth.   Yes [provider]  zolpidem (AMBIEN) 10 MG tablet  01/08/13  Yes [provider]  ALPRAZolam (XANAX) 0.5 MG tablet Take 0.5 mg by mouth 2 (two) times daily as needed. Patient not taking: Reported on 01/02/2023    [provider]  dipyridamole (PERSANTINE) 50 MG tablet Take 1 tablet (50 mg total) by mouth 3 (three) times daily. Patient not taking: Reported on 01/02/2023 02/22/16   Leone Haven, MD  dipyridamole-aspirin [facility]) 200-25 MG 12hr capsule Take by mouth. Patient not taking: Reported on 01/02/2023    [provider]  doxycycline (VIBRA-TABS) 100 MG tablet Take 100 mg by mouth daily. Patient not taking: Reported on 01/02/2023 12/13/22   [provider]  glucose blood test strip  11/03/12   [provider]  metFORMIN (GLUCOPHAGE-XR) 500 MG 24 hr tablet  11/02/12   [provider]    Physical Exam: Vitals:   01/02/23 2221 01/02/23 2230 01/02/23 2232 01/02/23 2300  BP: (!) 252/116 (!) 249/111  (!) 228/101  Pulse: (!) 106 (!) 102  73  Resp: (!) 21 (!) 21    Temp: 98.3 F (36.8 C)     TempSrc: Oral     SpO2: 97% 97%  96%  Weight:   53.5 kg   Height:   5' 5"$  (1.651 m)    Physical Exam Vitals and nursing  note reviewed.  Constitutional:      General: She is not in acute distress.    Appearance: She is underweight.     Comments: Frail-appearing elderly female  HENT:     Head: Normocephalic and atraumatic.  Cardiovascular:     Rate and Rhythm: Normal rate and regular rhythm.     Heart sounds: Normal heart sounds.  Pulmonary:     Effort: Pulmonary effort is normal.     Breath sounds: Normal breath sounds.  Abdominal:     Palpations: Abdomen is soft.     Tenderness: There is no abdominal tenderness.  Neurological:     Mental Status: Mental status is at baseline.     Labs on Admission: I have personally reviewed following labs and imaging studies  CBC: Recent Labs  Lab 01/02/23 2227  WBC 11.4*  NEUTROABS 8.3*  HGB 10.0*  HCT 32.1*  MCV 93.6  PLT 0000000   Basic Metabolic Panel: Recent Labs  Lab 01/02/23 2227  NA 139  K 4.1  CL 105  CO2 21*  GLUCOSE 293*  BUN 32*  CREATININE 1.44*  CALCIUM 9.3   GFR: Estimated Creatinine Clearance: 23.2 mL/min (A) (by C-G formula based on SCr of 1.44 mg/dL (H)). Liver Function Tests: Recent Labs  Lab 01/02/23 2227  AST 34  ALT 21  ALKPHOS 77  BILITOT 0.7  PROT 7.3  ALBUMIN 3.5   No results for input(s): "LIPASE", "AMYLASE" in the last 168 hours. No results for input(s): "AMMONIA" in the last 168 hours. Coagulation Profile: No results for input(s): "INR", "PROTIME" in the last 168 hours. Cardiac Enzymes: No results for input(s): "CKTOTAL", "CKMB", "CKMBINDEX", "TROPONINI" in the last 168 hours. BNP (last 3 results) No results for input(s): "PROBNP" in the last 8760 hours. HbA1C: No results for input(s): "HGBA1C" in the last 72 hours. CBG: No results for input(s): "GLUCAP" in the last 168 hours. Lipid Profile: No results for input(s): "CHOL", "HDL", "LDLCALC", "TRIG", "CHOLHDL", "LDLDIRECT" in the last 72 hours. Thyroid Function Tests: Recent Labs    01/02/23 2227  TSH 5.052*  FREET4 0.66   Anemia Panel: No results  for input(s): "VITAMINB12", "FOLATE", "FERRITIN", "TIBC", "IRON", "RETICCTPCT" in the last 72 hours. Urine analysis:    Component Value Date/Time   COLORURINE Yellow 08/02/2013 1716   APPEARANCEUR Hazy 08/02/2013 1716   LABSPEC 1.010 08/02/2013 1716   PHURINE 6.0 08/02/2013 1716   GLUCOSEU Negative 08/02/2013 1716   HGBUR Negative 08/02/2013 1716   BILIRUBINUR Negative 08/02/2013 1716   KETONESUR Negative 08/02/2013 1716   PROTEINUR 25 mg/dL 08/02/2013 1716   NITRITE Negative 08/02/2013 1716   LEUKOCYTESUR 1+ 08/02/2013 1716    Radiological Exams on Admission: CT Head Wo Contrast  Result Date: 01/02/2023 CLINICAL DATA:  Mental status change of unknown cause. History of COVID and pneumonia month ago. High blood pressure today. Confusion yesterday. EXAM: CT HEAD WITHOUT CONTRAST TECHNIQUE: Contiguous axial images were obtained from the base of the skull through the vertex without intravenous contrast. RADIATION DOSE REDUCTION: This exam was performed according to the departmental dose-optimization program which includes automated exposure control, adjustment of the mA and/or kV according to patient size and/or use of iterative reconstruction technique. COMPARISON:  MRI brain 02/13/2011.  CT head 02/12/2011 FINDINGS: Brain: Diffuse cerebral atrophy. Ventricular dilatation consistent with central atrophy. Low-attenuation changes in the deep white matter consistent with small vessel ischemia. No abnormal extra-axial fluid collections. No mass effect or midline shift. Gray-white matter junctions are distinct. Basal cisterns are not effaced. No acute intracranial hemorrhage. Vascular: No hyperdense vessel or unexpected calcification. Skull: Calvarium appears intact. Sinuses/Orbits: Secretions demonstrated in the sphenoid sinus. Paranasal sinuses and mastoid air cells are otherwise clear. Other: None. IMPRESSION: No acute intracranial abnormalities. Chronic atrophy and small vessel ischemic changes.  Electronically Signed   By: Lucienne Capers M.D.   On: 01/02/2023 23:08   DG Chest 1 View  Result Date: 01/02/2023 CLINICAL DATA:  Altered mental status.  Chest pain EXAM: CHEST  1 VIEW COMPARISON:  12/05/2012 FINDINGS: Heart size and pulmonary vascularity are normal. Bronchiectasis with bronchial wall thickening. Patchy nodular infiltrates in the left mid lung, some demonstrating central lucency. No pleural effusions. No pneumothorax. Mediastinal contours appear intact. Calcification of the aorta. IMPRESSION: Bronchiectasis and bronchial wall thickening with patchy nodular infiltrates in the left mid lung. Changes likely represent chronic bronchiectasis with  fluid-filled cystic bronchiectasis. Atypical infectious process such as TB or fungal infection or soft tissue nodules would be less likely considerations. Similar findings were present on prior CT from 07/29/2019. Electronically Signed   By: Lucienne Capers M.D.   On: 01/02/2023 22:54     Data Reviewed: Relevant notes from primary care and specialist visits, past discharge summaries as available in EHR, including Care Everywhere. Prior diagnostic testing as pertinent to current admission diagnoses Updated medications and problem lists for reconciliation ED course, including vitals, labs, imaging, treatment and response to treatment Triage notes, nursing and pharmacy notes and ED provider's notes Notable results as noted in HPI   Assessment and Plan: * Hypertensive emergency without congestive heart failure BP 252/116 on admission Received labetalol in the ED x 2 with minimal improvement Will start Cardene drip for better control  Focal neurological deficit Possible acute/subacute CVA, history of prior stroke with sensory ataxia Patient with intermittent confusion dysarthria expressive aphasia ongoing for the past couple weeks Possibly related to stroke versus hypertensive emergency/PRES Follow-up MRI Cardene infusion with goal blood  pressure under 200 pending stroke workup obtain blood work for lower goal Will get routine stroke workup to include continuous cardiac monitoring, carotid Doppler and echocardiogram PT OT and speech therapy consults Aspirin, statin Neurology consult   CAP (community acquired pneumonia) Possible sepsis Patient was treated for pneumonia a couple weeks prior as well as COVID Chest x-ray showing bronchiectasis, chronic, atypical infectious process such as TB WBC 11,000 with lactic acid 2.7 and patient tachycardic and tachypneic Will treat with IV fluid per sepsis protocol Rocephin and azithromycin  Acute metabolic encephalopathy Multifactorial, secondary to possible sepsis, hypertensive emergency, stroke, medication related Sinequan and Xanax Workup as outlined above Neurologic checks with fall and aspiration precautions Will keep n.p.o.  Uncontrolled type 2 diabetes mellitus with hyperglycemia, without long-term current use of insulin (HCC) Blood sugar in the 200s Sliding scale insulin coverage. Will hold home metformin in view of creatinine  Iron deficiency anemia due to chronic blood loss At baseline  Stage 3b chronic kidney disease (HCC) At baseline  Acquired hypothyroidism Elevated TSH but normal free T4 Continue levothyroxine  Autoimmune disorder (HCC) History of scleroderma.  Not on immunosuppressive drugs  Anxiety Will hold home Sinequan and Xanax      DVT prophylaxis: Lovenox  Consults: none  Advance Care Planning: full code  Family Communication: Daughter at bedside  Disposition Plan: Back to previous home environment  Severity of Illness: The appropriate patient status for this patient is INPATIENT. Inpatient status is judged to be reasonable and necessary in order to provide the required intensity of service to ensure the patient's safety. The patient's presenting symptoms, physical exam findings, and initial radiographic and laboratory data in the  context of their chronic comorbidities is felt to place them at high risk for further clinical deterioration. Furthermore, it is not anticipated that the patient will be medically stable for discharge from the hospital within 2 midnights of admission.   * I certify that at the point of admission it is my clinical judgment that the patient will require inpatient hospital care spanning beyond 2 midnights from the point of admission due to high intensity of service, high risk for further deterioration and high frequency of surveillance required.*  Author: Athena Masse, MD 01/03/2023 12:06 AM  For on call review [email or web address].

## 2023-01-03 NOTE — Hospital Course (Signed)
Hypothyroidism, hypertension, HLD, autoimmune disease/scleroderma, type 2 diabetes, prior stroke with sensory ataxia, recently treated for pneumonia with doxycycline completed about 2 weeks priorWho presents to the ED by EMS with concerns for confusion and elevated blood pressures.  Patient was apparently found outside in her open bathroom and underwear calling her daughter's name.  According to ED provider, daughter stated that she has been intermittently confused since being treated for pneumonia and has had episodes of dysarthria and getting her words out.  She fell about 5 days ago but suffered no injury.  She is intermittently very clear and her usual self but with episodic confusion.  Her respiratory symptoms seem to have improved. ED course and data review: On arrival BP 252/116 with pulse 106 and respirations 21.  WBC 11,400 with lactic acid 2.7.  Creatinine at baseline at 1.44.  Glucose 293.  Troponin 23.  TSH elevated at 5.052.  Free T4 WNL. EKG, personally viewed and interpreted shows sinus tachycardia at 106 with no acute ST-T wave changes. CT head was nonacute Chest x-ray showed persistent findings of bronchiectasis and other as follows: IMPRESSION: Bronchiectasis and bronchial wall thickening with patchy nodular infiltrates in the left mid lung. Changes likely represent chronic bronchiectasis with fluid-filled cystic bronchiectasis. Atypical infectious process such as TB or fungal infection or soft tissue nodules would be less likely considerations. Similar findings were present on prior CT from 07/29/2019.  Patient was treated with an IV bolus of NS and started on Rocephin and azithromycin for possible CAP.  She was also given IV labetalol for BP control, though BP remained elevated with systolic at [id number] at the time of admission. Hospitalist consulted for admission for hypertensive emergency, CAP and possible stroke outside tPA window given symptom onset a couple weeks prior.  MRI pending  at the time of request for admission

## 2023-01-03 NOTE — Assessment & Plan Note (Addendum)
A1c at 6.8.  Sliding scale insulin coverage.  Holding metformin.

## 2023-01-03 NOTE — Progress Notes (Addendum)
SLP Cancellation Note  Patient Details Name: Andrea Shah MRN: [id number] DOB: 1933/01/14   Cancelled treatment:       Reason Eval/Treat Not Completed: Patient at procedure or test/unavailable (chart reviewed; consulted pt/family in room)  Pt in middle of test at bedside. ST services will return for cognitive-linguistic evaluation time permitting. Dtr agreed.  Addendum: per Neurology note, pt was admitted for increased, intermittent confusion. Neurology noted revealed pt's presentation may be "more of an encephalopathy than a specific aphasia given her impairment across multiple cognitive domains. Suspect multifactorial etiology including delirium +/- post-covid encephalopathy and polypharmacy". Will continue to monitor for needs.       Orinda Kenner, MS, CCC-SLP Speech Language Pathologist Rehab Services; [place] [phone] (ascom) Andrea Shah 01/03/2023, 1:56 PM

## 2023-01-03 NOTE — ED Notes (Signed)
Pt's daughter remains at bedside. PT at bedside.

## 2023-01-03 NOTE — ED Notes (Signed)
PT assisted pt up to recliner; pt remains in recliner; pt given food tray and drink; pt resting calmly; visitor remains at bedside.

## 2023-01-03 NOTE — ED Notes (Signed)
Pt given lunch tray.

## 2023-01-03 NOTE — Assessment & Plan Note (Addendum)
Possible acute/subacute CVA, history of prior stroke with sensory ataxia Patient with intermittent confusion dysarthria expressive aphasia ongoing for the past couple weeks Possibly related to stroke versus hypertensive emergency/PRES Follow-up MRI Cardene infusion with goal blood pressure under 200 pending stroke workup obtain blood work for lower goal Will get routine stroke workup to include continuous cardiac monitoring, carotid Doppler and echocardiogram PT OT and speech therapy consults Aspirin, statin Neurology consult

## 2023-01-03 NOTE — ED Notes (Signed)
Pt provided with clean dry purewick and disposable brief at this time.

## 2023-01-03 NOTE — Assessment & Plan Note (Addendum)
Suspect secondary to hypertensive emergency.  Stroke ruled out with negative MRI.  Neurology following.  Could also be related to medication as patient is on a number of sedating medications and at times, may not be taking these consistently.  Seems to have difficulty finding right words.  With blood pressure control, much improved.

## 2023-01-03 NOTE — Progress Notes (Signed)
Anticoagulation monitoring(Lovenox):  89 yo female ordered Lovenox 40 mg Q24h    Filed Weights   01/02/23 2232  Weight: 53.5 kg (118 lb)   BMI 19.6   Lab Results  Component Value Date   CREATININE 1.44 (H) 01/02/2023   CREATININE 0.93 02/07/2016   CREATININE 0.89 08/02/2013   Estimated Creatinine Clearance: 23.2 mL/min (A) (by C-G formula based on SCr of 1.44 mg/dL (H)). Hemoglobin & Hematocrit     Component Value Date/Time   HGB 10.0 (L) 01/02/2023 2227   HGB 11.3 (L) 08/02/2013 1357   HCT 32.1 (L) 01/02/2023 2227   HCT 33.9 (L) 08/02/2013 1357     Per Protocol for Patient with estCrcl < 30 ml/min and BMI < 30, will transition to Lovenox 30 mg Q24h.

## 2023-01-03 NOTE — Assessment & Plan Note (Signed)
History of scleroderma.  Not on immunosuppressive drugs

## 2023-01-04 ENCOUNTER — Inpatient Hospital Stay
Admit: 2023-01-04 | Discharge: 2023-01-04 | Disposition: A | Discharge status: Home / Self Care | Payer: Medicare Other | Attending: Internal Medicine | Admitting: Internal Medicine

## 2023-01-04 ENCOUNTER — Encounter: Payer: Self-pay | Admitting: Internal Medicine

## 2023-01-04 DIAGNOSIS — R4182 Altered mental status, unspecified: Secondary | ICD-10-CM

## 2023-01-04 DIAGNOSIS — I6389 Other cerebral infarction: Secondary | ICD-10-CM

## 2023-01-04 DIAGNOSIS — I161 Hypertensive emergency: Secondary | ICD-10-CM

## 2023-01-04 DIAGNOSIS — I5033 Acute on chronic diastolic (congestive) heart failure: Secondary | ICD-10-CM

## 2023-01-04 LAB — BASIC METABOLIC PANEL
Anion gap: 8 (ref 5–15)
BUN: 26 mg/dL — ABNORMAL HIGH (ref 8–23)
CO2: 20 mmol/L — ABNORMAL LOW (ref 22–32)
Calcium: 8.7 mg/dL — ABNORMAL LOW (ref 8.9–10.3)
Chloride: 110 mmol/L (ref 98–111)
Creatinine, Ser: 1.08 mg/dL — ABNORMAL HIGH (ref 0.44–1.00)
GFR, Estimated: 50 mL/min — ABNORMAL LOW (ref >60)
Glucose, Bld: 171 mg/dL — ABNORMAL HIGH (ref 70–99)
Potassium: 3.7 mmol/L (ref 3.5–5.1)
Sodium: 138 mmol/L (ref 135–145)

## 2023-01-04 LAB — ECHOCARDIOGRAM COMPLETE
AR max vel: 2.54 cm2
AV Area VTI: 2.88 cm2
AV Area mean vel: 2.69 cm2
AV Mean grad: 7 mmHg
AV Peak grad: 11.6 mmHg
Ao pk vel: 1.7 m/s
Area-P 1/2: 4 cm2
Height: 65 in
MV VTI: 3.25 cm2
S' Lateral: 2.6 cm
Weight: 1888 oz

## 2023-01-04 LAB — RPR: RPR Ser Ql: NONREACTIVE

## 2023-01-04 MED ORDER — METOPROLOL TARTRATE 25 MG PO TABS
12.5000 mg | ORAL_TABLET | Freq: Two times a day (BID) | ORAL | Status: DC
  Administered 2023-01-04 – 2023-01-07 (×6): 12.5 mg via ORAL
  Filled 2023-01-04 (×6): qty 1

## 2023-01-04 MED ORDER — THIAMINE MONONITRATE 100 MG PO TABS
100.0000 mg | ORAL_TABLET | Freq: Every day | ORAL | Status: DC
  Administered 2023-01-04 – 2023-01-07 (×4): 100 mg via ORAL
  Filled 2023-01-04 (×4): qty 1

## 2023-01-04 MED ORDER — HALOPERIDOL LACTATE 5 MG/ML IJ SOLN
2.5000 mg | Freq: Once | INTRAMUSCULAR | Status: AC
  Administered 2023-01-04: 2.5 mg via INTRAVENOUS
  Filled 2023-01-04: qty 1

## 2023-01-04 MED ORDER — FUROSEMIDE 10 MG/ML IJ SOLN
20.0000 mg | Freq: Two times a day (BID) | INTRAMUSCULAR | Status: DC
  Administered 2023-01-04 (×2): 20 mg via INTRAVENOUS
  Filled 2023-01-04: qty 2
  Filled 2023-01-04: qty 4

## 2023-01-04 MED ORDER — ENOXAPARIN SODIUM 40 MG/0.4ML IJ SOSY
40.0000 mg | PREFILLED_SYRINGE | INTRAMUSCULAR | Status: DC
  Administered 2023-01-05: 40 mg via SUBCUTANEOUS
  Filled 2023-01-04: qty 0.4

## 2023-01-04 MED ORDER — METOPROLOL TARTRATE 25 MG PO TABS
25.0000 mg | ORAL_TABLET | Freq: Two times a day (BID) | ORAL | Status: DC
  Administered 2023-01-04: 25 mg via ORAL
  Filled 2023-01-04: qty 1

## 2023-01-04 NOTE — Consult Note (Addendum)
NEUROLOGY CONSULTATION NOTE   Date of service: January 04, 2023 Patient Name: Andrea Shah MRN:  [id number] DOB:  1933-01-06 Reason for consult: encephalopathy, ?aphasia Requesting physician: Dr. Gevena Barre _ _ _   _ __   _ __ _ _  __ __   _ __   __ _  History of Present Illness   This is an 89 year old woman with past medical history significant for hypothyroidism, hypertension, hyperlipidemia, scleroderma, type 2 diabetes, prior stroke with sensory ataxia recently treated for COVID with pneumonia with doxycycline completed about 2 weeks prior who presented to the ED for confusion and elevated blood pressure.  Daughter states that she has been intermittently confused since being treated for pneumonia 2 weeks earlier and has intermittent episodes of dysarthria with stuttering and difficulty getting her words out.  She fell about 5 days ago but did not suffer any injury at that time.  Her mental status seems to fluctuate, being her usual self at times, but with episodic confusion.  On my interview daughter reports that the speech symptoms as being more acute and particularly worse in the last 2 days.  On my examination the patient is friendly and confused, has difficulty getting her words out although more of a stuttering than an aphasia.  She is able to name simple objects for me.  She is not able to calculate or spell.  She is mildly anxious.  On arrival her blood pressure was 252/116.  White blood cell count was 11,400.  Creatinine was at baseline TSH elevated at 5.052 in the setting of known hypothyroidism.  Free T4 was within normal limits.  MRI brain showed no evidence of acute ischemia, PRES, or other acute abnl (personal review). UA not c/f infection.    ROS   Per HPI: all other systems reviewed and are negative  Past History   I have reviewed the following:  Past Medical History:  Diagnosis Date   Arthritis    Depression    Diabetes (HCC)    GERD (gastroesophageal reflux  disease)    High cholesterol    Hypertension    Stress incontinence    Stroke (cerebrum) ([place])    Thyroid dysfunction    UTI (lower urinary tract infection)    Past Surgical History:  Procedure Laterality Date   ABDOMINAL HYSTERECTOMY  1977   APPENDECTOMY  1979   BACK SURGERY     Family History  Problem Relation Age of Onset   Arthritis Father    Heart disease Father    Breast cancer Sister    Heart disease Mother    High blood pressure Mother    Diabetes Mother    Sudden death Other 58       Nephew, aneurysm   Diabetes Brother    Social History   Socioeconomic History   Marital status: Married    Spouse name: Not on file   Number of children: Not on file   Years of education: Not on file   Highest education level: Not on file  Occupational History    Comment: RETIRED  Tobacco Use   Smoking status: Former    Types: Cigarettes    Quit date: 10/24/1987    Years since quitting: 35.2   Smokeless tobacco: Never  Vaping Use   Vaping Use: Never used  Substance and Sexual Activity   Alcohol use: No    Alcohol/week: 0.0 standard drinks of alcohol   Drug use: No   Sexual activity: Not Currently  Other Topics Concern   Not on file  Social History Narrative   Not on file   Social Determinants of Health   Financial Resource Strain: Low Risk  (10/23/2017)   Overall Financial Resource Strain (CARDIA)    Difficulty of Paying Living Expenses: Not hard at all  Food Insecurity: No Food Insecurity (01/04/2023)   Hunger Vital Sign    Worried About Running Out of Food in the Last Year: Never true    Ran Out of Food in the Last Year: Never true  Transportation Needs: No Transportation Needs (01/04/2023)   PRAPARE - Hydrologist (Medical): No    Lack of Transportation (Non-Medical): No  Physical Activity: Inactive (10/23/2017)   Exercise Vital Sign    Days of Exercise per Week: 0 days    Minutes of Exercise per Session: 0 min  Stress: Not on file   Social Connections: Unknown (10/23/2017)   Social Connection and Isolation Panel [NHANES]    Frequency of Communication with Friends and Family: Not on file    Frequency of Social Gatherings with Friends and Family: Not on file    Attends Religious Services: Never    Marine scientist or Organizations: Yes    Attends Music therapist: More than 4 times per year    Marital Status: Not on file   Allergies  Allergen Reactions   Percocet [Oxycodone-Acetaminophen] Anaphylaxis   Aspirin     Does not take secondary to GI upset   Levaquin [Levofloxacin In D5w]    Penicillins Swelling   Sulfa Antibiotics     With childhood reaction of a rash   Vicodin [Hydrocodone-Acetaminophen] Anxiety    Medications   (Not in a hospital admission)     Current Facility-Administered Medications:     stroke: early stages of recovery book, , Does not apply, Once, Athena Masse, MD   0.9 %  sodium chloride infusion, , Intravenous, Continuous, Athena Masse, MD, Last Rate: 75 mL/hr at 01/04/23 0132, New Bag at 01/04/23 0132   acetaminophen (TYLENOL) tablet 650 mg, 650 mg, Oral, Q4H PRN **OR** acetaminophen (TYLENOL) 160 MG/5ML solution 650 mg, 650 mg, Per Tube, Q4H PRN **OR** acetaminophen (TYLENOL) suppository 650 mg, 650 mg, Rectal, Q4H PRN, Athena Masse, MD   ALPRAZolam Duanne Moron) tablet 1 mg, 1 mg, Oral, BID PRN, Annita Brod, MD, 1 mg at 01/04/23 0647   doxepin (SINEQUAN) capsule 50 mg, 50 mg, Oral, QHS, Gevena Barre K, MD, 50 mg at 01/03/23 2257   enoxaparin (LOVENOX) injection 30 mg, 30 mg, Subcutaneous, Q24H, Judd Gaudier V, MD, 30 mg at 01/03/23 0830   hydrALAZINE (APRESOLINE) tablet 100 mg, 100 mg, Oral, Q8H, Gevena Barre K, MD, 100 mg at 01/04/23 0646   isosorbide mononitrate (IMDUR) 24 hr tablet 30 mg, 30 mg, Oral, Daily, Gevena Barre K, MD, 30 mg at 01/03/23 1926   levothyroxine (SYNTHROID) tablet 100 mcg, 100 mcg, Oral, Q0600, Athena Masse, MD, 100 mcg  at 01/04/23 0647   nicardipine (CARDENE) 33m in 0.86% saline 2053mIV infusion (0.1 mg/ml), 3-15 mg/hr, Intravenous, Continuous, DuAthena MasseMD, Stopped at 01/04/23 0241  Current Outpatient Medications:    acetaminophen (TYLENOL) 500 MG tablet, Take 500 mg by mouth every 6 (six) hours as needed., Disp: , Rfl:    ALPRAZolam (XANAX) 1 MG tablet, Take 1 tablet (1 mg total) by mouth 2 (two) times daily as needed for anxiety., Disp: 60 tablet, Rfl: 2  doxepin (SINEQUAN) 50 MG capsule, Take 50 mg by mouth at bedtime., Disp: , Rfl:    hydrochlorothiazide (HYDRODIURIL) 25 MG tablet, Take 1 tablet (25 mg total) by mouth daily., Disp: 90 tablet, Rfl: 3   levothyroxine (SYNTHROID, LEVOTHROID) 100 MCG tablet, Take 100 mcg by mouth daily before breakfast., Disp: , Rfl:    meclizine (ANTIVERT) 12.5 MG tablet, Take 12.5 mg by mouth., Disp: , Rfl:    Multiple Vitamin (MULTIVITAMIN) tablet, Take by mouth., Disp: , Rfl:    omeprazole (PRILOSEC) 10 MG capsule, Take by mouth., Disp: , Rfl:    vitamin B-12 (CYANOCOBALAMIN) 100 MCG tablet, Take by mouth., Disp: , Rfl:    zolpidem (AMBIEN) 10 MG tablet, , Disp: , Rfl:    dipyridamole (PERSANTINE) 50 MG tablet, Take 1 tablet (50 mg total) by mouth 3 (three) times daily. (Patient not taking: Reported on 01/02/2023), Disp: 90 tablet, Rfl: 1   dipyridamole-aspirin (AGGRENOX) 200-25 MG 12hr capsule, Take by mouth. (Patient not taking: Reported on 01/02/2023), Disp: , Rfl:    glucose blood test strip, , Disp: , Rfl:    metFORMIN (GLUCOPHAGE-XR) 500 MG 24 hr tablet, , Disp: , Rfl:   Vitals   Vitals:   01/04/23 0630 01/04/23 0645 01/04/23 0646 01/04/23 0700  BP: (!) 166/76 (!) 161/109 (!) 161/109 (!) 171/66  Pulse: 92 99  96  Resp: (!) 23 14  (!) 24  Temp:      TempSrc:      SpO2: 99% 99%  100%  Weight:      Height:         Body mass index is 19.64 kg/m.  Physical Exam   Physical Exam Gen: alert, oriented to self and daughter at bedside HEENT:  Atraumatic, normocephalic;mucous membranes moist; oropharynx clear, tongue without atrophy or fasciculations. Neck: Supple, trachea midline. Resp: CTAB, no w/r/r CV: RRR, no m/g/r; nml S1 and S2. 2+ symmetric peripheral pulses. Abd: soft/NT/ND; nabs x 4 quad Extrem: Nml bulk; no cyanosis, clubbing, or edema.  Neuro: *MS: alert, oriented to self and daughter at bedside, not oriented to year or location, delayed recall 1/3, not able to calculate or spell *Speech: nondysarthric, able to name some but not all simple objects, significant delays in spoken responses, stuttering *CN:    I: Deferred   II,III: PERRLA, VFF by confrontation, optic discs unable to be visualized 2/2 pupillary constriction   III,IV,VI: EOMI w/o nystagmus, no ptosis   V: Sensation intact from V1 to V3 to LT   VII: Eyelid closure was full.  Smile symmetric.   VIII: Hearing intact to voice   IX,X: Voice normal, palate elevates symmetrically    XI: SCM/trap 5/5 bilat   XII: Tongue protrudes midline, no atrophy or fasciculations  *Motor:   Normal bulk.  No tremor, rigidity or bradykinesia. 4/5 strength throughout effort dependent. *Sensory: SILT. No double-simultaneous extinction.  *Coordination:  FNF without ataxia bilat *Reflexes:  1+ and symmetric throughout without clonus; toes down-going bilat *Gait: deferred   Labs   CBC:  Recent Labs  Lab 01/02/23 2227  WBC 11.4*  NEUTROABS 8.3*  HGB 10.0*  HCT 32.1*  MCV 93.6  PLT 0000000    Basic Metabolic Panel:  Lab Results  Component Value Date   NA 139 01/02/2023   K 4.1 01/02/2023   CO2 21 (L) 01/02/2023   GLUCOSE 293 (H) 01/02/2023   BUN 32 (H) 01/02/2023   CREATININE 1.44 (H) 01/02/2023   CALCIUM 9.3 01/02/2023   GFRNONAA 35 (  L) 01/02/2023   GFRAA >60 08/02/2013   Lipid Panel:  Lab Results  Component Value Date   LDLCALC 116 (H) 01/03/2023   HgbA1c:  Lab Results  Component Value Date   HGBA1C 6.7 (H) 01/03/2023   Urine Drug Screen: No results  found for: "LABOPIA", "COCAINSCRNUR", "LABBENZ", "AMPHETMU", "THCU", "LABBARB"  Alcohol Level No results found for: "ETH"   Impression   This is an 89 year old woman with past medical history significant for hypothyroidism, hypertension, hyperlipidemia, scleroderma, type 2 diabetes, prior stroke with sensory ataxia recently treated for COVID with pneumonia with doxycycline completed about 2 weeks prior who presented to the ED for intermittent confusion and hypertensive urgency. Exam is significant for orientation to self and daughter only, impaired delayed recall, impaired naming, stuttering speech, dyscalculia, and difficulty spelling. Her presentation overall seems to be more of an encephalopathy than a specific aphasia given her impairment across multiple cognitive domains. Suspect multifactorial etiology including delirium +/- post-covid encephalopathy and polypharmacy (89 yo on [place] as an outpatient, substituted with xanax here). It is possible given her age that she has some underlying cognitive issues that are amplified in this context although daughter states patient is highly functional at baseline and an excellent bridge player. Will perform some additional labwork to r/o treatable etiologies that may be contributing. Given the fluctuations in her mental status it would also be prudent to obtain an EEG. Patient is on cardene for BP. Of note there was no e/o PRES on her MRI on admission.  Recommendations   - Check B12, B1, RPR, ammonia - Thiamine 116m daily - rEEG - SLP cognitive/language eval - Will continue to follow ______________________________________________________________________   Thank you for the opportunity to take part in the care of this patient. If you have any further questions, please contact the neurology consultation attending.  Signed,  CSu Monks MD [facility] [phone] If 7pm- 7am, please page neurology on call as listed in  [place]  **Any copied and pasted documentation in this note was written by me in another application not billed for and pasted by me into this document.

## 2023-01-04 NOTE — Progress Notes (Signed)
Eeg done 

## 2023-01-04 NOTE — ED Notes (Signed)
Patient is alert and oriented x 3 but having episodes of confusion. Patient currently refusing AM labs or AM medications

## 2023-01-04 NOTE — Progress Notes (Signed)
[facility] Progress Note  Patient: Andrea Shah    [id number]  DOA: 01/02/2023    Date of Service: the patient was seen and examined on 01/04/2023  Brief hospital course: 89 year old female with past medical history of hypertension and hypothyroidism and scleroderma who presented to the emergency room on the night of 2/14 with confusion that had been progressing over the last 2 weeks.  Patient's baseline is alert and oriented x 3 and appropriate.  Patient's daughter noted that over the past few weeks she has had trouble talking and on day of presentation, she had gone into the garage looking for her other daughter who was not there.   In the emergency room, lab work positive for [place].  Initially thought to have CVA however CVA ruled out on MRI and CT.  Patient initially with hypertensive urgency with systolic blood pressure in the 240s and placed on Cardene drip.  Cardene drip able to be weaned off by 2/16 and mentation and speech difficulties both have improved.   Assessment and Plan: * Hypertensive emergency BP 252/116 on admission.  Initially placed on Cardene drip.  This is likely secondary to noncompliance for the last month patient's blood pressure medications.  Able to be weaned off of drip.  Additional medications including metoprolol, hydralazine and Imdur added.  Blood pressure now in the 160s and should improve further with diuresis.  There may be some component of mild heart failure.  Echo notes grade 1 diastolic dysfunction.  Of note, patient's mentation and speech issues are better today as blood pressure is better controlled.  Acute metabolic encephalopathy Suspect secondary to hypertensive emergency.  Stroke ruled out with negative MRI.  Neurology following.  Could also be related to medication as patient is on a number of sedating medications and at times, may not be taking these consistently.  Seems to have difficulty finding right words.  With blood pressure control,  much improved.  Acute on chronic diastolic CHF (congestive heart failure) ([place]) With elevated blood pressures, check BNP and found to be elevated at 471.  Patient started on IV Lasix.  New finding.  Echocardiogram notes grade 1 diastolic dysfunction  [id number] Sepsis ruled out.  Initially patient thought to have COVID 1 month ago, but she was tested at that time.  She had upper respiratory symptoms and then her daughter who was caring for her got sick with confirmed COVID a week later.  Patient also thought to have pneumonia and at that time given antibiotics.  Suspect pneumonia only been in [place] now.  COVID may be playing a small role in some of her confusion.  CRP only at 1.0 though.  No need for additional medications  Stage 3b chronic kidney disease ([place]) At baseline  Anxiety Resumed home dose of Sinequan and Xanax.  This medication may be playing a role in some of her confusion  Controlled type 2 diabetes mellitus without complication, without long-term current use of insulin (HCC) A1c at 6.8.  Sliding scale insulin coverage.  Holding metformin.  Iron deficiency anemia due to chronic blood loss At baseline  Autoimmune disorder (HCC) History of scleroderma.  Not on immunosuppressive drugs  Acquired hypothyroidism Elevated TSH but normal free T4.  Elevated TSH likely in the setting of acute illness Continue levothyroxine       Body mass index is 19.64 kg/m.        Consultants: Neurology  Procedures: Echocardiogram noting grade 1 diastolic dysfunction EEG pending  Antimicrobials: None  Code Status:  Full code   Subjective: Patient states that she is feeling okay, little tired.  Objective: Blood pressure elevated, but much improved Vitals:   01/04/23 1250 01/04/23 1713  BP: (!) 143/66 138/71  Pulse: 74 81  Resp: 16 18  Temp: 97.6 F (36.4 C) 97.9 F (36.6 C)  SpO2: 98% 97%    Intake/Output Summary (Last 24 hours) at 01/04/2023 1815 Last data filed at  01/04/2023 1607 Gross per 24 hour  Intake 1689.81 ml  Output --  Net 1689.81 ml   Filed Weights   01/02/23 2232  Weight: 53.5 kg   Body mass index is 19.64 kg/m.  Exam:  General: Alert and oriented x 3, no acute distress HEENT: Normocephalic, atraumatic, mucous membranes are moist Cardiovascular: Regular rate and rhythm, S1-S2 Respiratory: Clear to auscultation bilaterally Abdomen: Soft, nontender, nondistended, positive bowel sounds Musculoskeletal: No clubbing or cyanosis or edema Skin: No skin breaks, tears or lesions Psychiatry: Appropriate, no evidence of psychoses Neurology: No focal deficits.  Some mild benign resting tremor that continues.  Her word hesitation seems to have resolved  Data Reviewed: BNP of 471  Disposition:  Status is: Inpatient Remains inpatient appropriate because:  -Full diuresis -Control of blood pressure -Improvement in mentation -Evaluation by physical therapy    Anticipated discharge date: 2/18  Family Communication: Other daughter at bedside DVT Prophylaxis: enoxaparin (LOVENOX) injection 40 mg Start: 01/05/23 1000    Author: Annita Brod ,MD 01/04/2023 6:15 PM  To reach On-call, see care teams to locate the attending and reach out via [email or web address]. Between 7PM-7AM, please contact night-coverage If you still have difficulty reaching the attending provider, please page the [facility] (Director on Call) for [facility] on amion for assistance. Diabetes mellitus

## 2023-01-04 NOTE — ED Notes (Signed)
Patient is calm. Patient is alert and oriented to name, date of birth. SIL at bedside states patient is "acting more like her self, she is talking better and she acts better"

## 2023-01-04 NOTE — Assessment & Plan Note (Signed)
With elevated blood pressures, check BNP and found to be elevated at 471.  Patient started on IV Lasix.  New finding.  Echocardiogram notes grade 1 diastolic dysfunction

## 2023-01-04 NOTE — ED Notes (Signed)
Cardene drip stopped at this time due to BP 138/53

## 2023-01-04 NOTE — Progress Notes (Signed)
SLP Cancellation Note  Patient Details Name: Andrea Shah MRN: [id number] DOB: 10-27-1933   Cancelled treatment:       Reason Eval/Treat Not Completed: SLP screened, no needs identified, will sign off  While pt does appear confused, suspect her confusion is related to current illness rather than cognitive impairment.   Makelle Marrone B. Rutherford Nail, M.S., CCC-SLP, [place] Pathologist Certified Brain Injury [place]  [facility] [phone] Ascom [phone] Fax [phone]   Stormy Fabian 01/04/2023, 2:55 PM

## 2023-01-04 NOTE — ED Notes (Addendum)
Patient was requesting anxiety medication, medication brought into patient. Patient states "I changed my mind." "I do not want the medication" "I want to go home" I asked patient could I draw her morning labs and patient states "no, you are not drawing my labs. No" Sharion Settler, NP notified. No new orders received

## 2023-01-04 NOTE — Progress Notes (Signed)
*  PRELIMINARY RESULTS* Echocardiogram 2D Echocardiogram has been performed.  Andrea Shah 01/04/2023, 9:39 AM

## 2023-01-04 NOTE — ED Notes (Addendum)
Son in [place] at bedside

## 2023-01-04 NOTE — Assessment & Plan Note (Signed)
Sepsis ruled out.  Initially patient thought to have COVID 1 month ago, but she was tested at that time.  She had upper respiratory symptoms and then her daughter who was caring for her got sick with confirmed COVID a week later.  Patient also thought to have pneumonia and at that time given antibiotics.  Suspect pneumonia only been in [place] now.  COVID may be playing a small role in some of her confusion.  CRP only at 1.0 though.  No need for additional medications

## 2023-01-04 NOTE — ED Notes (Signed)
Patient refuses to let nurse take temp

## 2023-01-04 NOTE — ED Notes (Signed)
Patient is anxious and agitated

## 2023-01-04 NOTE — ED Notes (Signed)
Daughter at bedside.

## 2023-01-04 NOTE — ED Notes (Signed)
Attempted to perform NIH on patient however patient will not perform task when asked too. Patient states she refuses to do NIH

## 2023-01-04 NOTE — Procedures (Signed)
Routine EEG Report  Andrea Shah is a 89 y.o. female with a history of altered mental status who is undergoing an EEG to evaluate for seizures.  Report: This EEG was acquired with electrodes placed according to the International 10-20 electrode system (including Fp1, Fp2, F3, F4, C3, C4, P3, P4, O1, O2, T3, T4, T5, T6, A1, A2, Fz, Cz, Pz). The following electrodes were missing or displaced: none.  The occipital dominant rhythm was 8.5 Hz. This activity is reactive to stimulation. Drowsiness was manifested by background fragmentation; deeper stages of sleep were identified by K complexes and sleep spindles. There was no focal slowing. There were no interictal epileptiform discharges. There were no electrographic seizures identified. Photic stimulation and hyperventilation were not performed.   Impression: This EEG was obtained while awake and asleep and is normal.    Clinical Correlation: Normal EEGs, however, do not rule out epilepsy.  Su Monks, MD [facility] [phone]  If 7pm- 7am, please page neurology on call as listed in [place].

## 2023-01-04 NOTE — ED Notes (Signed)
Patient is agitated and anxious. Patient is paranoid of me, patient thinks I am going to hurt her. Patient states "I am not taking anything from you or letting you do anything"

## 2023-01-04 NOTE — Progress Notes (Signed)
Patient not examined today during rounds because she was off the floor getting procedure (EEG). I will follow up with her again tomorrow.  Su Monks, MD [facility] [phone]  If 7pm- 7am, please page neurology on call as listed in [place].

## 2023-01-05 LAB — BASIC METABOLIC PANEL
Anion gap: 11 (ref 5–15)
BUN: 30 mg/dL — ABNORMAL HIGH (ref 8–23)
CO2: 20 mmol/L — ABNORMAL LOW (ref 22–32)
Calcium: 9.1 mg/dL (ref 8.9–10.3)
Chloride: 107 mmol/L (ref 98–111)
Creatinine, Ser: 1.42 mg/dL — ABNORMAL HIGH (ref 0.44–1.00)
GFR, Estimated: 36 mL/min — ABNORMAL LOW (ref >60)
Glucose, Bld: 142 mg/dL — ABNORMAL HIGH (ref 70–99)
Potassium: 3.6 mmol/L (ref 3.5–5.1)
Sodium: 138 mmol/L (ref 135–145)

## 2023-01-05 MED ORDER — ENOXAPARIN SODIUM 30 MG/0.3ML IJ SOSY
30.0000 mg | PREFILLED_SYRINGE | INTRAMUSCULAR | Status: DC
  Administered 2023-01-06 – 2023-01-07 (×2): 30 mg via SUBCUTANEOUS
  Filled 2023-01-05 (×2): qty 0.3

## 2023-01-05 NOTE — Progress Notes (Signed)
Physical Therapy Treatment Patient Details Name: Andrea Shah MRN: [id number] DOB: 04/06/1933 Today's Date: 01/05/2023   History of Present Illness 89 y.o. female with medical history significant for Hypothyroidism, hypertension, HLD, autoimmune disease/scleroderma, type 2 diabetes, prior stroke with sensory ataxia, recently treated for Covid with pneumonia with doxycycline completed about 2 weeks prior.    PT Comments    Pt pleasant and motivated to work with PT.  She did need light assist with bed mobility and to rise to standing but was able to do much more than she had just 2 days ago on eval.  Pt needed reminders for hand placement and to shift weight forward on each of the 4 sit to stand efforts, but did manage to keep weight over [place] and do multiple small bouts of ambulation.  Pt reports minimal fatigue, HR remains stable 80-90s.  Family present and reports that they will be able to give increased support and do not wish to pursue rehab, if home will benefit from [place].  Continue with PT POC.   Recommendations for follow up therapy are one component of a multi-disciplinary discharge planning process, led by the attending physician.  Recommendations may be updated based on patient status, additional functional criteria and insurance authorization.  Follow Up Recommendations  Skilled nursing-short term rehab (<3 hours/day) (family reports will provide 24/7 assist, plan to take her home) Can patient physically be transported by private vehicle: Yes   Assistance Recommended at Discharge    Patient can return home with the following A lot of help with walking and/or transfers;A lot of help with bathing/dressing/bathroom;Assistance with cooking/housework;Help with stairs or ramp for entrance;Assist for transportation   Equipment Recommendations  None recommended by PT    Recommendations for Other Services       Precautions / Restrictions Precautions Precautions:  Fall Restrictions Weight Bearing Restrictions: No     Mobility  Bed Mobility Overal bed mobility: Needs Assistance Bed Mobility: Supine to Sit     Supine to sit: Min assist Sit to supine: Min assist   General bed mobility comments: Cues for rail use and rolling strategy, able to initiate movement but limited with elevation of torso this afternoon, min assist to rise to EOB    Transfers Overall transfer level: Needs assistance Equipment used: 1 person hand held assist Transfers: Sit to/from Stand Sit to Stand: Mod assist, Min assist           General transfer comment: 4x STS t/o session.  Each time needing cuing for UE use on bed/rails.  She was able to initiate upward effort but did need minA to completely get to standing... pt with posterior bias, but with tactile min assist to shift forward into [place].    Ambulation/Gait Ambulation/Gait assistance: Min assist Gait Distance (Feet): 8 Feet Assistive device: Rolling Call (2 wheels)         General Gait Details: multiple short bouts of ambulation.  58f, 317f 8 ft with rest breaks between.  Pt needing constant cuing to insure keeping weight over the [place], very halting short steps without a lot of confidence, unsteadiness but with min assist to insure keeping weight over [place] .   Stairs             Wheelchair Mobility    Modified Rankin (Stroke Patients Only)       Balance Overall balance assessment: Needs assistance Sitting-balance support: Bilateral upper extremity supported Sitting balance-Leahy Scale: Good     Standing balance support: Bilateral upper  extremity supported Standing balance-Leahy Scale: Fair Standing balance comment: Pt with posterior bias, but with cuing and assurance of appropriate use of [place] she did maintain balance with CGA once assisted to fully upright                            Cognition Arousal/Alertness: Awake/alert Behavior During Therapy: WFL for tasks  assessed/performed Overall Cognitive Status: Within Functional Limits for tasks assessed                                 General Comments: Still with mild confusion, but much more able to meaningfully participate with PT        Exercises      General Comments General comments (skin integrity, edema, etc.): Pt much more alert and much more able to particiapte with standing/mobility tasks than on eval 2 days ago      Pertinent Vitals/Pain Pain Assessment Faces Pain Scale: Hurts a little bit Pain Location: chronic sclerodermic pain    Home Living                          Prior Function            PT Goals (current goals can now be found in the care plan section) Progress towards PT goals: Progressing toward goals    Frequency    Min 2X/week      PT Plan Current plan remains appropriate    Co-evaluation              AM-PAC PT "6 Clicks" Mobility   Outcome Measure  Help needed turning from your back to your side while in a flat bed without using bedrails?: A Little Help needed moving from lying on your back to sitting on the side of a flat bed without using bedrails?: A Little Help needed moving to and from a bed to a chair (including a wheelchair)?: A Little Help needed standing up from a chair using your arms (e.g., wheelchair or bedside chair)?: A Little Help needed to walk in hospital room?: A Lot Help needed climbing 3-5 steps with a railing? : Total 6 Click Score: 15    End of Session   Activity Tolerance: Patient limited by fatigue Patient left: with chair alarm set;with call bell/phone within reach;with nursing/sitter in room;with family/visitor present Nurse Communication: Mobility status PT Visit Diagnosis: Muscle weakness (generalized) (M62.81);Difficulty in walking, not elsewhere classified (R26.2);Unsteadiness on feet (R26.81)     Time: [id number] PT Time Calculation (min) (ACUTE ONLY): 25 min  Charges:  $Gait  Training: 8-22 mins $Therapeutic Activity: 8-22 mins                     Kreg Shropshire, DPT 01/05/2023, 3:54 PM

## 2023-01-05 NOTE — Progress Notes (Signed)
PHARMACIST - PHYSICIAN COMMUNICATION  CONCERNING:  Enoxaparin (Lovenox) for DVT Prophylaxis    RECOMMENDATION: Patient was prescribed enoxaprin 34m q24 hours for VTE prophylaxis.   Filed Weights   01/02/23 2232  Weight: 53.5 kg (118 lb)    Body mass index is 19.64 kg/m.  Estimated Creatinine Clearance: 23.6 mL/min (A) (by C-G formula based on SCr of 1.42 mg/dL (H)).   Patient is candidate for enoxaparin 394mevery 24 hours based on CrCl <3076min   DESCRIPTION: [facility]  Patient is now receiving enoxaparin 30 mg every 24 hours    RodDallie PilesharmD Clinical Pharmacist  01/05/2023 9:50 AM

## 2023-01-05 NOTE — TOC Progression Note (Signed)
Transition of Care [facility]) - Progression Note    Patient Details  Name: AYVEN COUFAL MRN: [id number] Date of Birth: October 08, 1933  Transition of Care [facility]) CM/SW [place], [place] Phone Number: 01/05/2023, 3:11 PM  Clinical Narrative:     CSW received call from daughter Leveda Anna who reports family has discussed and they would like to take patient home with [facility], Jodie reports she will take off of work and stay with patient. Reports no dme needs. Jodie will transport home at dc. Agreeable to [facility] no preference of [place] agency.   Expected Discharge Plan: [place] Barriers to Discharge: Continued Medical Work up  Expected Discharge Plan and Services       Living arrangements for the past 2 months: Single Family Home                                       Social Determinants of Health (SDOH) Interventions SDOH Screenings   Food Insecurity: No Food Insecurity (01/04/2023)  Housing: Low Risk  (01/04/2023)  Transportation Needs: No Transportation Needs (01/04/2023)  Utilities: Not At Risk (01/04/2023)  Financial Resource Strain: Low Risk  (10/23/2017)  Physical Activity: Inactive (10/23/2017)  Social Connections: Unknown (10/23/2017)  Tobacco Use: Medium Risk (01/04/2023)    Readmission Risk Interventions     No data to display

## 2023-01-05 NOTE — Progress Notes (Signed)
Occupational Therapy Treatment Patient Details Name: Andrea Shah MRN: [id number] DOB: [birth year] Today's Date: 01/05/2023   History of present illness 89 y.o. female with medical history significant for Hypothyroidism, hypertension, HLD, autoimmune disease/scleroderma, type 2 diabetes, prior stroke with sensory ataxia, recently treated for Covid with pneumonia with doxycycline completed about 2 weeks prior.   OT comments  Pt received semi-reclined in bed, c/o pain (chronic neck, shoulders, back); daughter at bedside and encouraging throughout session. Appearing alert; willing to work with OT on t/f to EOB and stretching for UE/back/neck comfort. T/f to standing with MOD A HHA and OT blocking knees; pt more comfortable when daughter provided HHA for other side as well; took a few sidesteps MIN A. See flowsheet below for further details of session. Left semi-reclined in bed; daughter at bedside; with all needs in reach.     Recommendations for follow up therapy are one component of a multi-disciplinary discharge planning process, led by the attending physician.  Recommendations may be updated based on patient status, additional functional criteria and insurance authorization.    Follow Up Recommendations  Skilled nursing-short term rehab (<3 hours/day)     Assistance Recommended at Discharge Frequent or constant Supervision/Assistance  Patient can return home with the following  A lot of help with walking and/or transfers;A lot of help with bathing/dressing/bathroom;Help with stairs or ramp for entrance;Assist for transportation;Assistance with cooking/housework   Equipment Recommendations  Other (comment) (defer to next venue of care; use RW at home)    Recommendations for Other Services      Precautions / Restrictions Precautions Precautions: Fall Restrictions Weight Bearing Restrictions: No       Mobility Bed Mobility Overal bed mobility: Needs Assistance Bed Mobility: Supine  to Sit     Supine to sit: Min guard Sit to supine: Min guard   General bed mobility comments: Pt requiring cues for how to move her body to/from EOB; cues to use rail and pull/push self upright. did not need physical assist of OT today though.    Transfers Overall transfer level: Needs assistance Equipment used: 1 person hand held assist Transfers: Sit to/from Stand Sit to Stand: Mod assist           General transfer comment: Once pt standing (OT blocking knee for safety) daughter came to pt's other (L) side to provide handheld assist as well, as pt mildly anxious while standing. Able to stand with MIN A handheld x2; took a few sidesteps towards [place]. Daughter reporting that pt has not walked in approx 1 month prior to admission.     Balance                                           ADL either performed or assessed with clinical judgement   ADL Overall ADL's : Needs assistance/impaired                                       General ADL Comments: Decreased mobility impairing ADLs. BIL UE functional today during session. MIN A to change hospital gown today while seated EOB.    Extremity/Trunk Assessment Upper Extremity Assessment Upper Extremity Assessment: Generalized weakness   Lower Extremity Assessment Lower Extremity Assessment: Generalized weakness;Defer to PT evaluation        Vision  Perception     Praxis      Cognition Arousal/Alertness: Awake/alert Behavior During Therapy: WFL for tasks assessed/performed Overall Cognitive Status: Within Functional Limits for tasks assessed                                 General Comments: Daughter in room; pt is conversant, pleasant, motivated to sit at EOB to stretch her shoulders/back; remembers RN's name when he enters the room. Pt telling OT about her bridge group and strategies she has used for pain management (socialization, anxiety medication) at home.         Exercises      Shoulder Instructions       General Comments OT led pt through UE, shoulder, and neck stretches for pain management; appearing to have moderate effect on pt's pain. Pt interested in heating pad; OT reached out to RN and MD; RN reporting that we do not have them at this hospital. Notified pt and daughter.    Pertinent Vitals/ Pain       Pain Assessment Pain Assessment: 0-10 Pain Score:  (unrated; moderate; chronic) Pain Location: shoulders, neck, back (chronic pain; decades) Pain Descriptors / Indicators: Aching Pain Intervention(s): Limited activity within patient's tolerance, Patient requesting pain meds-RN notified  Home Living                                          Prior Functioning/Environment              Frequency  Min 2X/week        Progress Toward Goals  OT Goals(current goals can now be found in the care plan section)  Progress towards OT goals: Progressing toward goals  Acute Rehab OT Goals Patient Stated Goal: Get better; pain to improve OT Goal Formulation: With patient Time For Goal Achievement: 01/17/23 Potential to Achieve Goals: Good ADL Goals Pt Will Perform Grooming: with supervision;standing Pt Will Perform Lower Body Dressing: with supervision;sit to/from stand Pt Will Transfer to Toilet: with supervision;ambulating Pt Will Perform Toileting - Clothing Manipulation and hygiene: with supervision;sit to/from stand  Plan Discharge plan remains appropriate    Co-evaluation                 AM-PAC OT "6 Clicks" Daily Activity     Outcome Measure   Help from another person eating meals?: None Help from another person taking care of personal grooming?: A Little Help from another person toileting, which includes using toliet, bedpan, or urinal?: A Lot Help from another person bathing (including washing, rinsing, drying)?: A Lot Help from another person to put on and taking off regular upper body  clothing?: A Little Help from another person to put on and taking off regular lower body clothing?: A Lot 6 Click Score: 16    End of Session Equipment Utilized During Treatment:  (handheld assist)  OT Visit Diagnosis: Unsteadiness on feet (R26.81);Repeated falls (R29.6);Muscle weakness (generalized) (M62.81);Cognitive communication deficit (R41.841)   Activity Tolerance Patient tolerated treatment well   Patient Left in bed;with bed alarm set;with family/visitor present;with call bell/phone within reach   Nurse Communication Mobility status        Time: [id number] OT Time Calculation (min): 34 min  Charges: OT General Charges $OT Visit: 1 Visit OT Treatments $Therapeutic Activity: 23-37 mins  Waymon Amato, MS,  OTR/L   Vania Rea 01/05/2023, 1:08 PM

## 2023-01-05 NOTE — TOC Initial Note (Signed)
Transition of Care [facility]) - Initial/Assessment Note    Patient Details  Name: Andrea Shah MRN: [id number] Date of Birth: 10/20/1933  [facility]) CM/SW Contact:    Tiburcio Bash, LCSW Phone Number: 01/05/2023, 12:01 PM  Clinical Narrative:                  CSW notes patient not fully oriented, CSW spoke with patient's daughter Butch Penny regarding SNF recommendations. She reports being in agreement and for referrals to be sent out for bed offers, she asked this CSW to call her sister Leveda Anna to ensure she is also in agreement with SNF. CSW has lvm with Leveda Anna.   Referrals sent out pending bed offers.   Expected Discharge Plan: Skilled Nursing Facility Barriers to Discharge: Continued Medical Work up   Patient Goals and CMS Choice Patient states their goals for this hospitalization and ongoing recovery are:: to go home CMS Medicare.gov Compare Post Acute Care list provided to:: Patient Represenative (must comment) (daughter Butch Penny) Choice offered to / list presented to : Adult Children      Expected Discharge Plan and Services       Living arrangements for the past 2 months: Single Family Home                                      Prior Living Arrangements/Services Living arrangements for the past 2 months: Single Family Home Lives with:: Self                   Activities of Daily Living Home Assistive Devices/Equipment: Environmental consultant (specify type), Cane (specify quad or straight) ADL Screening (condition at time of admission) Patient's cognitive ability adequate to safely complete daily activities?: Yes Is the patient deaf or have difficulty hearing?: No Does the patient have difficulty seeing, even when wearing glasses/contacts?: No Does the patient have difficulty concentrating, remembering, or making decisions?: No Patient able to express need for assistance with ADLs?: Yes Does the patient have difficulty dressing or bathing?: No Independently  performs ADLs?: Yes (appropriate for developmental age) Does the patient have difficulty walking or climbing stairs?: Yes Weakness of Legs: Both Weakness of Arms/Hands: Both  Permission Sought/Granted                  Emotional Assessment              Admission diagnosis:  Hypertensive crisis [I16.9] Hypertensive urgency [I16.0] Hypertensive emergency without congestive heart failure [I16.1] Altered mental status, unspecified altered mental status type [id number] Acute metabolic encephalopathy [id number] Community acquired pneumonia, unspecified laterality [J18.9] Patient Active Problem List   Diagnosis Date Noted   Acute on chronic diastolic CHF (congestive heart failure) ([place]) 01/04/2023   Controlled type 2 diabetes mellitus without complication, without long-term current use of insulin ([place]) Q000111Q   Acute metabolic encephalopathy Q000111Q   Hypertensive urgency 01/03/2023   Hypertensive emergency 01/02/2023   COVID-19 01/02/2023   Stage 3b chronic kidney disease ([place]) 03/15/2021   Iron deficiency anemia due to chronic blood loss 09/07/2020   Acquired hypothyroidism 01/01/2018   Sensory ataxia 02/09/2016   Anxiety 02/09/2016   Leg swelling 02/09/2016   Autoimmune disorder ([place]) 02/09/2016   Essential hypertension 02/09/2016   Anemia 02/09/2016   Palpitations 02/09/2016   PCP:  Rusty Aus, MD Pharmacy:   [facility] [place], [place]  [address] [place] [address] Phone: [phone] Fax: [phone]     Social Determinants of Health (SDOH) Social History: [place]: No Food Insecurity (01/04/2023)  Housing: Low Risk  (01/04/2023)  Transportation Needs: No Transportation Needs (01/04/2023)  Utilities: Not At Risk (01/04/2023)  Financial Resource Strain: Low Risk  (10/23/2017)  Physical Activity: Inactive (10/23/2017)  Social Connections:  Unknown (10/23/2017)  Tobacco Use: Medium Risk (01/04/2023)   SDOH Interventions:     Readmission Risk Interventions     No data to display

## 2023-01-05 NOTE — NC FL2 (Signed)
[place] LEVEL OF CARE FORM     IDENTIFICATION  Patient Name: Andrea Shah Birthdate: 1933/11/15 Sex: female Admission Date (Current Location): 01/02/2023  [facility] and [place] Number:  Engineering geologist and Address:  [facility], [address] [place], [place] [address]      Provider Number: [id number]  Attending Physician Name and Address:  Annita Brod, MD  Relative Name and Phone Number:  Butch Penny (daughter) [phone]    Current Level of Care: Hospital Recommended Level of Care: [place] Prior Approval Number:    Date Approved/Denied:   PASRR Number: [id number]  Discharge Plan: SNF    Current Diagnoses: Patient Active Problem List   Diagnosis Date Noted   Acute on chronic diastolic CHF (congestive heart failure) ([place]) 01/04/2023   Controlled type 2 diabetes mellitus without complication, without long-term current use of insulin ([place]) Q000111Q   Acute metabolic encephalopathy Q000111Q   Hypertensive urgency 01/03/2023   Hypertensive emergency 01/02/2023   COVID-19 01/02/2023   Stage 3b chronic kidney disease ([place]) 03/15/2021   Iron deficiency anemia due to chronic blood loss 09/07/2020   Acquired hypothyroidism 01/01/2018   Sensory ataxia 02/09/2016   Anxiety 02/09/2016   Leg swelling 02/09/2016   Autoimmune disorder ([place]) 02/09/2016   Essential hypertension 02/09/2016   Anemia 02/09/2016   Palpitations 02/09/2016    Orientation RESPIRATION BLADDER Height & Weight     Self, Time  Normal Incontinent, External catheter Weight: 118 lb (53.5 kg) Height:  5' 5"$  (165.1 cm)  BEHAVIORAL SYMPTOMS/MOOD NEUROLOGICAL BOWEL NUTRITION STATUS      Continent Diet (see discharge summary)  AMBULATORY STATUS COMMUNICATION OF NEEDS Skin   Extensive Assist Verbally Normal                       Personal Care Assistance Level of Assistance  Bathing, Feeding, Dressing, Total care Bathing  Assistance: Limited assistance Feeding assistance: Limited assistance Dressing Assistance: Limited assistance Total Care Assistance: Maximum assistance   Functional Limitations Info  Sight, Hearing, Speech Sight Info: Impaired Hearing Info: Adequate Speech Info: Adequate    SPECIAL CARE FACTORS FREQUENCY  PT (By licensed PT), OT (By licensed OT)     PT Frequency: min 4x weekly OT Frequency: min 4x weekly            Contractures Contractures Info: Not present    Additional Factors Info  Code Status, Allergies Code Status Info: full Allergies Info: Percocet (Oxycodone-acetaminophen)  Aspirin  Levaquin (Levofloxacin In D5w)  Penicillins  Sulfa Antibiotics  Vicodin (Hydrocodone-acetaminophen)           Current Medications (01/05/2023):  This is the current hospital active medication list Current Facility-Administered Medications  Medication Dose Route Frequency Provider Last Rate Last Admin   acetaminophen (TYLENOL) tablet 650 mg  650 mg Oral Q4H PRN Annita Brod, MD   650 mg at 01/05/23 0843   Or   acetaminophen (TYLENOL) 160 MG/5ML solution 650 mg  650 mg Per Tube Q4H PRN Annita Brod, MD       Or   acetaminophen (TYLENOL) suppository 650 mg  650 mg Rectal Q4H PRN Annita Brod, MD       ALPRAZolam Duanne Moron) tablet 1 mg  1 mg Oral BID PRN Annita Brod, MD   1 mg at 01/05/23 1109   doxepin (SINEQUAN) capsule 50 mg  50 mg Oral QHS Annita Brod, MD   50 mg at 01/04/23  2114   [START ON 01/06/2023] enoxaparin (LOVENOX) injection 30 mg  30 mg Subcutaneous Q24H Dallie Piles, RPH       hydrALAZINE (APRESOLINE) tablet 100 mg  100 mg Oral Q8H Gevena Barre K, MD   100 mg at 01/05/23 0606   isosorbide mononitrate (IMDUR) 24 hr tablet 30 mg  30 mg Oral Daily Annita Brod, MD   30 mg at 01/05/23 0844   levothyroxine (SYNTHROID) tablet 100 mcg  100 mcg Oral Q0600 Annita Brod, MD   100 mcg at 01/05/23 0606   metoprolol tartrate (LOPRESSOR)  tablet 12.5 mg  12.5 mg Oral BID Annita Brod, MD   12.5 mg at 01/05/23 0843   nicardipine (CARDENE) 39m in 0.86% saline 2048mIV infusion (0.1 mg/ml)  3-15 mg/hr Intravenous Continuous KrAnnita BrodMD   Stopped at 01/04/23 0240   thiamine (VITAMIN B1) tablet 100 mg  100 mg Oral Daily KrAnnita BrodMD   100 mg at 01/05/23 [id number]   Discharge Medications: Please see discharge summary for a list of discharge medications.  Relevant Imaging Results:  Relevant Lab Results:   Additional Information [id number] BashLCSW

## 2023-01-05 NOTE — Progress Notes (Signed)
[facility] Progress Note  Patient: Andrea Shah    [id number]  DOA: 01/02/2023    Date of Service: the patient was seen and examined on 01/05/2023  Brief hospital course: 89 year old female with past medical history of hypertension and hypothyroidism and scleroderma who presented to the emergency room on the night of 2/14 with confusion that had been progressing over the last 2 weeks.  Patient's baseline is alert and oriented x 3 and appropriate.  Patient's daughter noted that over the past few weeks she has had trouble talking and on day of presentation, she had gone into the garage looking for her other daughter who was not there.   In the emergency room, lab work positive for [place].  Initially thought to have CVA however CVA ruled out on MRI and CT.  Patient initially with hypertensive urgency with systolic blood pressure in the 240s and placed on Cardene drip.  Cardene drip able to be weaned off by 2/16 and mentation and speech difficulties both have improved.   Assessment and Plan: * Hypertensive emergency-resolved as of 01/05/2023 BP 252/116 on admission.  Initially placed on Cardene drip.  This is likely secondary to noncompliance for the last month patient's blood pressure medications.  Able to be weaned off of drip.  Additional medications including metoprolol, hydralazine and Imdur added.  Blood pressure now in the 160s and should improve further with diuresis.  There may be some component of mild heart failure.  Echo notes grade 1 diastolic dysfunction.  Of note, patient's mentation and speech issues are better today as blood pressure is better controlled.  Acute metabolic encephalopathy Suspect secondary to hypertensive emergency.  Stroke ruled out with negative MRI.  Neurology following.  Could also be related to medication as patient is on a number of sedating medications and at times, may not be taking these consistently.  Seems to have difficulty finding right words.  With  blood pressure control, much improved.  I do suspect the patient has some mild underlying dementia, in part from being in the hospital still contributing to this.  However her initial presentation and symptoms have resolved.  Neurology signed off.  EEG unremarkable.  Acute on chronic diastolic CHF (congestive heart failure) ([place]) With elevated blood pressures, check BNP and found to be elevated at 471.  Patient started on IV Lasix.  New finding.  Echocardiogram notes grade 1 diastolic dysfunction  [id number] Sepsis ruled out.  Initially patient thought to have COVID 1 month ago, but she was tested at that time.  She had upper respiratory symptoms and then her daughter who was caring for her got sick with confirmed COVID a week later.  Patient also thought to have pneumonia and at that time given antibiotics.  Suspect pneumonia only been in [place] now.  COVID may be playing a small role in some of her confusion.  CRP only at 1.0 though.  No need for additional medications  Stage 3b chronic kidney disease ([place]) At baseline  Anxiety Resumed home dose of Sinequan and Xanax.  This medication may be playing a role in some of her confusion  Controlled type 2 diabetes mellitus without complication, without long-term current use of insulin (HCC) A1c at 6.8.  Sliding scale insulin coverage.  Holding metformin.  Iron deficiency anemia due to chronic blood loss At baseline  Autoimmune disorder (HCC) History of scleroderma.  Not on immunosuppressive drugs  Acquired hypothyroidism Elevated TSH but normal free T4.  Elevated TSH likely in the setting of  acute illness Continue levothyroxine       Body mass index is 19.64 kg/m.        Consultants: Neurology  Procedures: Echocardiogram noting grade 1 diastolic dysfunction EEG Normal  Antimicrobials: None  Code Status: Full code   Subjective: Patient states that she is feeling okay, little tired.  Objective: Blood pressure elevated, but  much improved Vitals:   01/05/23 0933 01/05/23 1228  BP: (!) 158/70 (!) 145/70  Pulse: 83 72  Resp: 16 12  Temp: 99.4 F (37.4 C) 97.8 F (36.6 C)  SpO2: 95% 96%    Intake/Output Summary (Last 24 hours) at 01/05/2023 1632 Last data filed at 01/04/2023 2130 Gross per 24 hour  Intake --  Output 400 ml  Net -400 ml   Filed Weights   01/02/23 2232  Weight: 53.5 kg   Body mass index is 19.64 kg/m.  Exam:  General: Alert and oriented x 3, no acute distress HEENT: Normocephalic, atraumatic, mucous membranes are moist Cardiovascular: Regular rate and rhythm, S1-S2 Respiratory: Clear to auscultation bilaterally Abdomen: Soft, nontender, nondistended, positive bowel sounds Musculoskeletal: No clubbing or cyanosis or edema Skin: No skin breaks, tears or lesions Psychiatry: Appropriate, no evidence of psychoses Neurology: No focal deficits.  Some mild benign resting tremor that continues.  Her word hesitation seems to have resolved  Data Reviewed: BNP of 471  Disposition:  Status is: Inpatient Remains inpatient appropriate because:  -Control of blood pressure -Needs skilled nursing    Anticipated discharge date: 2/19  Family Communication: Will call daughter DVT Prophylaxis: enoxaparin (LOVENOX) injection 30 mg Start: 01/06/23 1000    Author: Annita Brod ,MD 01/05/2023 4:32 PM  To reach On-call, see care teams to locate the attending and reach out via [email or web address]. Between 7PM-7AM, please contact night-coverage If you still have difficulty reaching the attending provider, please page the [facility] (Director on Call) for [facility] on amion for assistance. Diabetes mellitus

## 2023-01-06 DIAGNOSIS — M25519 Pain in unspecified shoulder: Secondary | ICD-10-CM

## 2023-01-06 DIAGNOSIS — M542 Cervicalgia: Secondary | ICD-10-CM

## 2023-01-06 DIAGNOSIS — N1832 Chronic kidney disease, stage 3b: Secondary | ICD-10-CM

## 2023-01-06 LAB — BASIC METABOLIC PANEL
Anion gap: 9 (ref 5–15)
BUN: 33 mg/dL — ABNORMAL HIGH (ref 8–23)
CO2: 21 mmol/L — ABNORMAL LOW (ref 22–32)
Calcium: 9 mg/dL (ref 8.9–10.3)
Chloride: 108 mmol/L (ref 98–111)
Creatinine, Ser: 1.45 mg/dL — ABNORMAL HIGH (ref 0.44–1.00)
GFR, Estimated: 35 mL/min — ABNORMAL LOW (ref >60)
Glucose, Bld: 161 mg/dL — ABNORMAL HIGH (ref 70–99)
Potassium: 3.5 mmol/L (ref 3.5–5.1)
Sodium: 138 mmol/L (ref 135–145)

## 2023-01-06 LAB — SEDIMENTATION RATE: Sed Rate: 66 mm/h — ABNORMAL HIGH (ref 0–30)

## 2023-01-06 MED ORDER — ALPRAZOLAM 0.5 MG PO TABS
0.5000 mg | ORAL_TABLET | Freq: Three times a day (TID) | ORAL | Status: DC | PRN
  Administered 2023-01-06 – 2023-01-07 (×2): 0.5 mg via ORAL
  Filled 2023-01-06 (×2): qty 1

## 2023-01-06 MED ORDER — HYDRALAZINE HCL 100 MG PO TABS: 100.0000 mg | ORAL_TABLET | Freq: Three times a day (TID) | ORAL | 1 refills | Status: AC

## 2023-01-06 MED ORDER — ISOSORBIDE MONONITRATE ER 30 MG PO TB24: 30.0000 mg | ORAL_TABLET | Freq: Every day | ORAL | 1 refills | Status: AC

## 2023-01-06 MED ORDER — VITAMIN B-12 100 MCG PO TABS
100.0000 ug | ORAL_TABLET | Freq: Every day | ORAL | Status: DC
  Administered 2023-01-06 – 2023-01-07 (×2): 100 ug via ORAL
  Filled 2023-01-06 (×2): qty 1

## 2023-01-06 MED ORDER — SODIUM CHLORIDE 0.9 % IV SOLN: INTRAVENOUS | Status: DC

## 2023-01-06 MED ORDER — TRAMADOL HCL 50 MG PO TABS
50.0000 mg | ORAL_TABLET | Freq: Four times a day (QID) | ORAL | Status: DC | PRN
  Administered 2023-01-06 (×2): 50 mg via ORAL
  Filled 2023-01-06 (×3): qty 1

## 2023-01-06 MED ORDER — METOPROLOL TARTRATE 25 MG PO TABS: 12.5000 mg | ORAL_TABLET | Freq: Two times a day (BID) | ORAL | 2 refills | Status: AC

## 2023-01-06 MED ORDER — FUROSEMIDE 10 MG/ML IJ SOLN
20.0000 mg | Freq: Two times a day (BID) | INTRAMUSCULAR | Status: DC
  Filled 2023-01-06: qty 2

## 2023-01-06 MED ORDER — DICLOFENAC EPOLAMINE 1.3 % EX PTCH
1.0000 | MEDICATED_PATCH | Freq: Two times a day (BID) | CUTANEOUS | Status: DC
  Administered 2023-01-06 – 2023-01-07 (×3): 1 via TRANSDERMAL
  Filled 2023-01-06 (×4): qty 1

## 2023-01-06 MED ORDER — VITAMIN B-1 100 MG PO TABS: 100.0000 mg | ORAL_TABLET | Freq: Every day | ORAL | 1 refills | Status: AC

## 2023-01-06 MED ORDER — FUROSEMIDE 10 MG/ML IJ SOLN
20.0000 mg | Freq: Once | INTRAMUSCULAR | Status: AC
  Administered 2023-01-07: 20 mg via INTRAVENOUS
  Filled 2023-01-06: qty 2

## 2023-01-06 NOTE — Discharge Summary (Incomplete)
Physician Discharge Summary   Patient: Andrea Shah MRN: [id number] DOB: 03-06-1933  Admit date:     01/02/2023  Discharge date: {dischdate:26783}  Discharge Physician: Annita Brod   PCP: Rusty Aus, MD   Recommendations at discharge:  {Tip this will not be part of the note when signed- Example include specific recommendations for outpatient follow-up, pending tests to follow-up on. (Optional):26781}  ***  Discharge Diagnoses: Active Problems:   Acute metabolic encephalopathy   Acute on chronic diastolic CHF (congestive heart failure) (HCC)   COVID-19   Stage 3b chronic kidney disease (HCC)   Anxiety   Iron deficiency anemia due to chronic blood loss   Controlled type 2 diabetes mellitus without complication, without long-term current use of insulin (HCC)   Autoimmune disorder (HCC)   Acquired hypothyroidism   Neck and shoulder pain  Principal Problem (Resolved):   Hypertensive emergency  Hospital Course: Hypothyroidism, hypertension, HLD, autoimmune disease/scleroderma, type 2 diabetes, prior stroke with sensory ataxia, recently treated for pneumonia with doxycycline completed about 2 weeks priorWho presents to the ED by EMS with concerns for confusion and elevated blood pressures.  Patient was apparently found outside in her open bathroom and underwear calling her daughter's name.  According to ED provider, daughter stated that she has been intermittently confused since being treated for pneumonia and has had episodes of dysarthria and getting her words out.  She fell about 5 days ago but suffered no injury.  She is intermittently very clear and her usual self but with episodic confusion.  Her respiratory symptoms seem to have improved. ED course and data review: On arrival BP 252/116 with pulse 106 and respirations 21.  WBC 11,400 with lactic acid 2.7.  Creatinine at baseline at 1.44.  Glucose 293.  Troponin 23.  TSH elevated at 5.052.  Free T4 WNL. EKG, personally  viewed and interpreted shows sinus tachycardia at 106 with no acute ST-T wave changes. CT head was nonacute Chest x-ray showed persistent findings of bronchiectasis and other as follows: IMPRESSION: Bronchiectasis and bronchial wall thickening with patchy nodular infiltrates in the left mid lung. Changes likely represent chronic bronchiectasis with fluid-filled cystic bronchiectasis. Atypical infectious process such as TB or fungal infection or soft tissue nodules would be less likely considerations. Similar findings were present on prior CT from 07/29/2019.  Patient was treated with an IV bolus of NS and started on Rocephin and azithromycin for possible CAP.  She was also given IV labetalol for BP control, though BP remained elevated with systolic at [id number] at the time of admission. Hospitalist consulted for admission for hypertensive emergency, CAP and possible stroke outside tPA window given symptom onset a couple weeks prior.  MRI pending at the time of request for admission  Assessment and Plan: * Hypertensive emergency-resolved as of 01/05/2023 BP 252/116 on admission.  Initially placed on Cardene drip.  This is likely secondary to noncompliance for the last month patient's blood pressure medications.  Able to be weaned off of drip.  Additional medications including metoprolol, hydralazine and Imdur added.  Blood pressure now in the 160s and should improve further with diuresis.  There may be some component of mild heart failure.  Echo notes grade 1 diastolic dysfunction.  Of note, patient's mentation and speech issues are better today as blood pressure is better controlled.  Acute metabolic encephalopathy Suspect secondary to hypertensive emergency.  Stroke ruled out with negative MRI.  Neurology following.  Could also be related to medication as patient is  on a number of sedating medications and at times, may not be taking these consistently.  Seems to have difficulty finding right words.   With blood pressure control, much improved.  I do suspect the patient has some mild underlying dementia, in part from being in the hospital still contributing to this.  However her initial presentation and symptoms have resolved.  Neurology signed off.  EEG unremarkable.  Acute on chronic diastolic CHF (congestive heart failure) ([place]) With elevated blood pressures, check BNP and found to be elevated at 471.  Patient started on IV Lasix.  New finding.  Echocardiogram notes grade 1 diastolic dysfunction  [id number] Sepsis ruled out.  Initially patient thought to have COVID 1 month ago, but she was tested at that time.  She had upper respiratory symptoms and then her daughter who was caring for her got sick with confirmed COVID a week later.  Patient also thought to have pneumonia and at that time given antibiotics.  Suspect pneumonia only been in [place] now.  COVID may be playing a small role in some of her confusion.  CRP only at 1.0 though.  No need for additional medications  Stage 3b chronic kidney disease ([place]) At baseline  Anxiety Resumed home dose of Sinequan and Xanax.  This medication may be playing a role in some of her confusion  Controlled type 2 diabetes mellitus without complication, without long-term current use of insulin (HCC) A1c at 6.8.  Sliding scale insulin coverage.  Holding metformin.  Iron deficiency anemia due to chronic blood loss At baseline  Autoimmune disorder (HCC) History of scleroderma.  Not on immunosuppressive drugs  Acquired hypothyroidism Elevated TSH but normal free T4.  Elevated TSH likely in the setting of acute illness Continue levothyroxine  Neck and shoulder pain Patient has chronic issues with this.  States that it is worse whenever there is a weather front happening.  Having quite a bit of discomfort starting 2/18 morning.  At home, she says she takes Tylenol and diclofenac patches.  Have ordered diclofenac patches as well as Ultram.  Also states  that she takes Xanax for this and explained that Xanax is not a pain medication.      {Tip this will not be part of the note when signed Body mass index is 19.64 kg/m. , ,  (Optional):26781}  {(NOTE) Pain control PDMP Statment (Optional):26782} Consultants: *** Procedures performed: ***  Disposition: {Plan; Disposition:26390} Diet recommendation:  Discharge Diet Orders (From admission, onward)     Start     Ordered   01/06/23 0000  Diet - low sodium heart healthy        01/06/23 1732           {Diet_Plan:26776} DISCHARGE MEDICATION: Allergies as of 01/06/2023       Reactions   Percocet [oxycodone-acetaminophen] Anaphylaxis   Aspirin    Does not take secondary to GI upset   Levaquin [levofloxacin In D5w]    Penicillins Swelling   Sulfa Antibiotics    With childhood reaction of a rash   Vicodin [hydrocodone-acetaminophen] Anxiety        Medication List     TAKE these medications    acetaminophen 500 MG tablet Commonly known as: TYLENOL Take 500 mg by mouth every 6 (six) hours as needed.   ALPRAZolam 1 MG tablet Commonly known as: Xanax Take 1 tablet (1 mg total) by mouth 2 (two) times daily as needed for anxiety.   doxepin 50 MG capsule Commonly known as: SINEQUAN Take  50 mg by mouth at bedtime.   glucose blood test strip   hydrALAZINE 100 MG tablet Commonly known as: APRESOLINE Take 1 tablet (100 mg total) by mouth every 8 (eight) hours.   hydrochlorothiazide 25 MG tablet Commonly known as: HYDRODIURIL Take 1 tablet (25 mg total) by mouth daily.   isosorbide mononitrate 30 MG 24 hr tablet Commonly known as: IMDUR Take 1 tablet (30 mg total) by mouth daily. Start taking on: January 07, 2023   levothyroxine 100 MCG tablet Commonly known as: SYNTHROID Take 100 mcg by mouth daily before breakfast.   meclizine 12.5 MG tablet Commonly known as: ANTIVERT Take 12.5 mg by mouth.   metFORMIN 500 MG 24 hr tablet Commonly known as: GLUCOPHAGE-XR    metoprolol tartrate 25 MG tablet Commonly known as: LOPRESSOR Take 0.5 tablets (12.5 mg total) by mouth 2 (two) times daily.   multivitamin tablet Take by mouth.   omeprazole 10 MG capsule Commonly known as: PRILOSEC Take by mouth.   thiamine 100 MG tablet Commonly known as: Vitamin B-1 Take 1 tablet (100 mg total) by mouth daily. Start taking on: January 07, 2023   vitamin B-12 100 MCG tablet Commonly known as: CYANOCOBALAMIN Take by mouth.   zolpidem 10 MG tablet Commonly known as: AMBIEN        Follow-up Information     Rusty Aus, MD. Schedule an appointment as soon as possible for a visit in 2 week(s).   Specialty: Internal Medicine Contact information: [address] [place] [address] [phone]                Discharge Exam: Danley Danker Weights   01/02/23 2232  Weight: 53.5 kg   ***  Condition at discharge: {DC Condition:26389}  The results of significant diagnostics from this hospitalization (including imaging, microbiology, ancillary and laboratory) are listed below for reference.   Imaging Studies: EEG adult  Result Date: 01-24-2023 Derek Jack, MD     01-24-2023  8:26 PM Routine EEG Report GILBERTA SOBIECH is a 89 y.o. female with a history of altered mental status who is undergoing an EEG to evaluate for seizures. Report: This EEG was acquired with electrodes placed according to the International 10-20 electrode system (including Fp1, Fp2, F3, F4, C3, C4, P3, P4, O1, O2, T3, T4, T5, T6, A1, A2, Fz, Cz, Pz). The following electrodes were missing or displaced: none. The occipital dominant rhythm was 8.5 Hz. This activity is reactive to stimulation. Drowsiness was manifested by background fragmentation; deeper stages of sleep were identified by K complexes and sleep spindles. There was no focal slowing. There were no interictal epileptiform discharges. There were no electrographic seizures identified.  Photic stimulation and hyperventilation were not performed. Impression: This EEG was obtained while awake and asleep and is normal.   Clinical Correlation: Normal EEGs, however, do not rule out epilepsy. Su Monks, MD [facility] [phone] If 7pm- 7am, please page neurology on call as listed in [place].   ECHOCARDIOGRAM COMPLETE  Result Date: 01/24/23    ECHOCARDIOGRAM REPORT   Patient Name:   LIZABETH OSTING Date of Exam: Jan 24, 2023 Medical Rec #:  [id number]      Height:       65.0 in Accession #:    [id number]     Weight:       118.0 lb Date of Birth:  1933/10/16      BSA:          1.581  m Patient Age:    67 years       BP:           171/66 mmHg Patient Gender: F              HR:           97 bpm. Exam Location:  [facility] Procedure: 2D Echo, Cardiac Doppler and Color Doppler Indications:     Stroke  History:         Patient has no prior history of Echocardiogram examinations.                  Stroke; Risk Factors:Hypertension and Diabetes. COVID +.  Sonographer:     Wenda Low Referring Phys:  [id number] Athena Masse Diagnosing Phys: Neoma Laming  Sonographer Comments: Technically difficult study due to poor echo windows and suboptimal apical window. IMPRESSIONS  1. Left ventricular ejection fraction, by estimation, is 60 to 65%. The left ventricle has normal function. The left ventricle has no regional wall motion abnormalities. Left ventricular diastolic parameters are consistent with Grade I diastolic dysfunction (impaired relaxation).  2. Right ventricular systolic function is normal. The right ventricular size is normal. There is normal pulmonary artery systolic pressure.  3. Left atrial size was mildly dilated.  4. The mitral valve is normal in structure. Mild mitral valve regurgitation. No evidence of mitral stenosis.  5. The aortic valve is normal in structure. Aortic valve regurgitation is mild. Aortic valve sclerosis/calcification is present, without any evidence of aortic stenosis.   6. The inferior vena cava is normal in size with greater than 50% respiratory variability, suggesting right atrial pressure of 3 mmHg. FINDINGS  Left Ventricle: Left ventricular ejection fraction, by estimation, is 60 to 65%. The left ventricle has normal function. The left ventricle has no regional wall motion abnormalities. The left ventricular internal cavity size was normal in size. There is  no left ventricular hypertrophy. Left ventricular diastolic parameters are consistent with Grade I diastolic dysfunction (impaired relaxation). Right Ventricle: The right ventricular size is normal. No increase in right ventricular wall thickness. Right ventricular systolic function is normal. There is normal pulmonary artery systolic pressure. The tricuspid regurgitant velocity is 2.23 m/s, and  with an assumed right atrial pressure of 3 mmHg, the estimated right ventricular systolic pressure is [id number] mmHg. Left Atrium: Left atrial size was mildly dilated. Right Atrium: Right atrial size was normal in size. Pericardium: There is no evidence of pericardial effusion. Mitral Valve: The mitral valve is normal in structure. Mild mitral valve regurgitation. No evidence of mitral valve stenosis. MV peak gradient, 11.4 mmHg. The mean mitral valve gradient is 5.0 mmHg. Tricuspid Valve: The tricuspid valve is normal in structure. Tricuspid valve regurgitation is trivial. No evidence of tricuspid stenosis. Aortic Valve: The aortic valve is normal in structure. Aortic valve regurgitation is mild. Aortic valve sclerosis/calcification is present, without any evidence of aortic stenosis. Aortic valve mean gradient measures 7.0 mmHg. Aortic valve peak gradient measures 11.6 mmHg. Aortic valve area, by VTI measures 2.88 cm. Pulmonic Valve: The pulmonic valve was normal in structure. Pulmonic valve regurgitation is not visualized. No evidence of pulmonic stenosis. Aorta: The aortic root is normal in size and structure. Venous: The inferior  vena cava is normal in size with greater than 50% respiratory variability, suggesting right atrial pressure of 3 mmHg. IAS/Shunts: No atrial level shunt detected by color flow Doppler.  LEFT VENTRICLE PLAX 2D LVIDd:  3.90 cm LVIDs:         2.60 cm LV PW:         1.10 cm LV IVS:        1.00 cm LVOT diam:     1.90 cm LV SV:         100 LV SV Index:   63 LVOT Area:     2.84 cm  RIGHT VENTRICLE RV Basal diam:  3.45 cm RV Mid diam:    2.70 cm RV S prime:     19.20 cm/s TAPSE (M-mode): 2.6 cm LEFT ATRIUM           Index        RIGHT ATRIUM           Index LA diam:      3.40 cm 2.15 cm/m   RA Area:     14.20 cm LA Vol (A4C): 31.1 ml 19.67 ml/m  RA Volume:   35.50 ml  22.46 ml/m  AORTIC VALVE                     PULMONIC VALVE AV Area (Vmax):    2.54 cm      PV Vmax:       1.99 m/s AV Area (Vmean):   2.69 cm      PV Peak grad:  15.8 mmHg AV Area (VTI):     2.88 cm AV Vmax:           170.00 cm/s AV Vmean:          119.000 cm/s AV VTI:            0.347 m AV Peak Grad:      11.6 mmHg AV Mean Grad:      7.0 mmHg LVOT Vmax:         152.00 cm/s LVOT Vmean:        113.000 cm/s LVOT VTI:          0.353 m LVOT/AV VTI ratio: 1.02  AORTA Ao Root diam: 3.95 cm Ao Asc diam:  3.60 cm MITRAL VALVE                TRICUSPID VALVE MV Area (PHT): 4.00 cm     TR Peak grad:   19.9 mmHg MV Area VTI:   3.25 cm     TR Vmax:        223.00 cm/s MV Peak grad:  11.4 mmHg MV Mean grad:  5.0 mmHg     SHUNTS MV Vmax:       1.69 m/s     Systemic VTI:  0.35 m MV Vmean:      96.2 cm/s    Systemic Diam: 1.90 cm MV E velocity: 106.00 cm/s MV A velocity: 52.90 cm/s MV E/A ratio:  2.00 Shaukat Khan Electronically signed by Neoma Laming Signature Date/Time: 01/04/2023/12:01:15 PM    Final    US Carotid Bilateral (at [facility] and [facility] only)  Result Date: 01/03/2023 CLINICAL DATA:  Stroke, hypertension, hyperlipidemia, diabetes EXAM: BILATERAL CAROTID DUPLEX ULTRASOUND TECHNIQUE: Pearline Cables scale imaging, color Doppler and duplex ultrasound were performed  of bilateral carotid and vertebral arteries in the neck. COMPARISON:  02/13/2011 by report only FINDINGS: Criteria: Quantification of carotid stenosis is based on velocity parameters that correlate the residual internal carotid diameter with NASCET-based stenosis levels, using the diameter of the distal internal carotid lumen as the denominator for stenosis measurement. The following velocity measurements were obtained: RIGHT ICA: 215/29 cm/sec  CCA: [id number] cm/sec SYSTOLIC ICA/CCA RATIO:  3.1 ECA: 200 cm/sec LEFT ICA: 108/15 cm/sec CCA: [id number] cm/sec SYSTOLIC ICA/CCA RATIO:  1.3 ECA: 130 cm/sec RIGHT CAROTID ARTERY: Tortuous common carotid with diffuse intimal thickening. Partially calcified eccentric plaque in the carotid bulb, proximal and mid ICA with at least moderate stenosis with velocities up to 130 cm/seconds. Poor angle correction in the distal ICA results in spurious velocities. Normal waveforms and color Doppler signal throughout. RIGHT VERTEBRAL ARTERY:  Normal flow direction and waveform. LEFT CAROTID ARTERY: Diffuse intimal thickening in the common carotid artery. Eccentric partially calcified plaque in the bulb and ICA origin resulting in at least mild stenosis. Normal waveforms and color Doppler signal throughout. LEFT VERTEBRAL ARTERY:  Normal flow direction and waveform. IMPRESSION: 1. Bilateral carotid bifurcation plaque resulting in 50-69% diameter right ICA stenosis, less than 50% diameter left ICA stenosis. 2. Antegrade bilateral vertebral arterial flow. Electronically Signed   By: Lucrezia Europe M.D.   On: 01/03/2023 15:11   MR BRAIN WO CONTRAST  Result Date: 01/03/2023 CLINICAL DATA:  Altered mental status, hypertension EXAM: MRI HEAD WITHOUT CONTRAST TECHNIQUE: Multiplanar, multiecho pulse sequences of the brain and surrounding structures were obtained without intravenous contrast. COMPARISON:  02/13/2011 MRI head, correlation is also made with CT head 01/02/2023 FINDINGS: Brain: No restricted  diffusion to suggest acute or subacute infarct. No acute hemorrhage, mass, mass effect, or midline shift. No hydrocephalus or extra-axial collection. Normal pituitary and craniocervical junction. No hemosiderin deposition to suggest remote hemorrhage. Remote cortical infarct in the right frontal lobe (series 15, image 30). Cerebral volume is within normal limits for age. Vascular: Normal arterial flow voids. Skull and upper cervical spine: Normal marrow signal. Sinuses/Orbits: Clear paranasal sinuses. No acute finding in the orbits. Other: The mastoid air cells are well aerated. IMPRESSION: No acute intracranial process. No evidence of acute or subacute infarct. Electronically Signed   By: Merilyn Baba M.D.   On: 01/03/2023 00:38   CT Head Wo Contrast  Result Date: 01/02/2023 CLINICAL DATA:  Mental status change of unknown cause. History of COVID and pneumonia month ago. High blood pressure today. Confusion yesterday. EXAM: CT HEAD WITHOUT CONTRAST TECHNIQUE: Contiguous axial images were obtained from the base of the skull through the vertex without intravenous contrast. RADIATION DOSE REDUCTION: This exam was performed according to the departmental dose-optimization program which includes automated exposure control, adjustment of the mA and/or kV according to patient size and/or use of iterative reconstruction technique. COMPARISON:  MRI brain 02/13/2011.  CT head 02/12/2011 FINDINGS: Brain: Diffuse cerebral atrophy. Ventricular dilatation consistent with central atrophy. Low-attenuation changes in the deep white matter consistent with small vessel ischemia. No abnormal extra-axial fluid collections. No mass effect or midline shift. Gray-white matter junctions are distinct. Basal cisterns are not effaced. No acute intracranial hemorrhage. Vascular: No hyperdense vessel or unexpected calcification. Skull: Calvarium appears intact. Sinuses/Orbits: Secretions demonstrated in the sphenoid sinus. Paranasal sinuses  and mastoid air cells are otherwise clear. Other: None. IMPRESSION: No acute intracranial abnormalities. Chronic atrophy and small vessel ischemic changes. Electronically Signed   By: Lucienne Capers M.D.   On: 01/02/2023 23:08   DG Chest 1 View  Result Date: 01/02/2023 CLINICAL DATA:  Altered mental status.  Chest pain EXAM: CHEST  1 VIEW COMPARISON:  12/05/2012 FINDINGS: Heart size and pulmonary vascularity are normal. Bronchiectasis with bronchial wall thickening. Patchy nodular infiltrates in the left mid lung, some demonstrating central lucency. No pleural effusions. No pneumothorax. Mediastinal contours appear intact. Calcification of  the aorta. IMPRESSION: Bronchiectasis and bronchial wall thickening with patchy nodular infiltrates in the left mid lung. Changes likely represent chronic bronchiectasis with fluid-filled cystic bronchiectasis. Atypical infectious process such as TB or fungal infection or soft tissue nodules would be less likely considerations. Similar findings were present on prior CT from 07/29/2019. Electronically Signed   By: Lucienne Capers M.D.   On: 01/02/2023 22:54    Microbiology: Results for orders placed or performed during the hospital encounter of 01/02/23  Resp panel by RT-PCR (RSV, Flu A&B, Covid) Anterior Nasal Swab     Status: Abnormal   Collection Time: 01/03/23  1:56 AM   Specimen: Anterior Nasal Swab  Result Value Ref Range Status   SARS Coronavirus 2 by RT PCR POSITIVE (A) NEGATIVE Final    Comment: (NOTE) SARS-CoV-2 target nucleic acids are DETECTED.  The SARS-CoV-2 RNA is generally detectable in upper respiratory specimens during the acute phase of infection. Positive results are indicative of the presence of the identified virus, but do not rule out bacterial infection or co-infection with other pathogens not detected by the test. Clinical correlation with patient history and other diagnostic information is necessary to determine patient infection  status. The expected result is Negative.  Fact Sheet for Patients: EntrepreneurPulse.com.au  Fact Sheet for Healthcare Providers: IncredibleEmployment.be  This test is not yet approved or cleared by the [place] FDA and  has been authorized for detection and/or diagnosis of SARS-CoV-2 by FDA under an Emergency Use Authorization (EUA).  This EUA will remain in effect (meaning this test can be used) for the duration of  the COVID-19 declaration under Section 564(b)(1) of the A ct, 21 U.S.C. section 360bbb-3(b)(1), unless the authorization is terminated or revoked sooner.     Influenza A by PCR NEGATIVE NEGATIVE Final   Influenza B by PCR NEGATIVE NEGATIVE Final    Comment: (NOTE) The Xpert Xpress SARS-CoV-2/FLU/RSV plus assay is intended as an aid in the diagnosis of influenza from Nasopharyngeal swab specimens and should not be used as a sole basis for treatment. Nasal washings and aspirates are unacceptable for Xpert Xpress SARS-CoV-2/FLU/RSV testing.  Fact Sheet for Patients: EntrepreneurPulse.com.au  Fact Sheet for Healthcare Providers: IncredibleEmployment.be  This test is not yet approved or cleared by the [place] FDA and has been authorized for detection and/or diagnosis of SARS-CoV-2 by FDA under an Emergency Use Authorization (EUA). This EUA will remain in effect (meaning this test can be used) for the duration of the COVID-19 declaration under Section 564(b)(1) of the Act, 21 U.S.C. section 360bbb-3(b)(1), unless the authorization is terminated or revoked.     Resp Syncytial Virus by PCR NEGATIVE NEGATIVE Final    Comment: (NOTE) Fact Sheet for Patients: EntrepreneurPulse.com.au  Fact Sheet for Healthcare Providers: IncredibleEmployment.be  This test is not yet approved or cleared by the [place] FDA and has been authorized for detection  and/or diagnosis of SARS-CoV-2 by FDA under an Emergency Use Authorization (EUA). This EUA will remain in effect (meaning this test can be used) for the duration of the COVID-19 declaration under Section 564(b)(1) of the Act, 21 U.S.C. section 360bbb-3(b)(1), unless the authorization is terminated or revoked.  Performed at [facility], [address] [place], [place] [address]     Labs: CBC: Recent Labs  Lab 01/02/23 2227  WBC 11.4*  NEUTROABS 8.3*  HGB 10.0*  HCT 32.1*  MCV 93.6  PLT 0000000   Basic Metabolic Panel: Recent Labs  Lab 01/02/23 2227 01/04/23 0454 01/05/23  0622 01/06/23 0338  Andrea 139 138 138 138  K 4.1 3.7 3.6 3.5  CL 105 110 107 108  CO2 21* 20* 20* 21*  GLUCOSE 293* 171* 142* 161*  BUN 32* 26* 30* 33*  CREATININE 1.44* 1.08* 1.42* 1.45*  CALCIUM 9.3 8.7* 9.1 9.0   Liver Function Tests: Recent Labs  Lab 01/02/23 2227  AST 34  ALT 21  ALKPHOS 77  BILITOT 0.7  PROT 7.3  ALBUMIN 3.5   CBG: No results for input(s): "GLUCAP" in the last 168 hours.  Discharge time spent: {LESS THAN/GREATER [id number] 30 minutes.  Signed: Annita Brod, MD [facility] 01/06/2023

## 2023-01-06 NOTE — Assessment & Plan Note (Signed)
Patient has chronic issues with this.  States that it is worse whenever there is a weather front happening.  Having quite a bit of discomfort starting 2/18 morning.  At home, she says she takes Tylenol and diclofenac patches.  Have ordered diclofenac patches as well as Ultram.  Also states that she takes Xanax for this and explained that Xanax is not a pain medication.

## 2023-01-06 NOTE — Plan of Care (Signed)
  Problem: Education: Goal: Knowledge of disease or condition will improve Outcome: Progressing   Problem: Self-Care: Goal: Ability to communicate needs accurately will improve Outcome: Progressing   Problem: Nutrition: Goal: Risk of aspiration will decrease Outcome: Progressing   Problem: Nutrition: Goal: Dietary intake will improve Outcome: Progressing   Problem: Education: Goal: Knowledge of General Education information will improve Description: Including pain rating scale, medication(s)/side effects and non-pharmacologic comfort measures Outcome: Progressing   Problem: Activity: Goal: Risk for activity intolerance will decrease Outcome: Progressing   Problem: Coping: Goal: Level of anxiety will decrease Outcome: Progressing   Problem: Pain Managment: Goal: General experience of comfort will improve Outcome: Progressing   Problem: Safety: Goal: Ability to remain free from injury will improve Outcome: Progressing   Problem: Skin Integrity: Goal: Risk for impaired skin integrity will decrease Outcome: Progressing

## 2023-01-06 NOTE — Plan of Care (Signed)
Neurology plan of care  Patient's encephalopathy resolved with BP control. Workup for additional treatable etiologies that may have been contributing including B12, RPR, ammonia, EEG were unremarkable. Neurology to sign off, but please re-engage if additional neurologic concerns arise.  Su Monks, MD [facility] [phone]  If 7pm- 7am, please page neurology on call as listed in [place].

## 2023-01-06 NOTE — Plan of Care (Signed)

## 2023-01-06 NOTE — Progress Notes (Signed)
       CROSS COVER NOTE  NAME: Andrea Shah MRN: [id number] DOB : 10/02/1933 ATTENDING PHYSICIAN: Annita Brod, MD    Date of Service   01/06/2023   HPI/Events of Note   Notified of elevated BP --> 216/76  Interventions   Assessment/Plan:  5 mg IV Hydralazine --> F/U BP 186/81      To reach the provider On-Call:   7AM- 7PM see care teams to locate the attending and reach out to them via [email or web address] Password: [email or web address] 7PM-7AM contact night-coverage If you still have difficulty reaching the appropriate provider, please page the [facility] (Director on Call) for [facility] on amion for assistance  This document was prepared using Systems analyst and may include unintentional dictation errors.  Neomia Glass DNP, MBA, FNP-BC, PMHNP-BC Nurse Practitioner [facility] Pager [phone]

## 2023-01-06 NOTE — Progress Notes (Signed)
[facility] Progress Note  Patient: Andrea Shah    [id number]  DOA: 01/02/2023    Date of Service: the patient was seen and examined on 01/06/2023  Brief hospital course: 89 year old female with past medical history of hypertension and hypothyroidism and scleroderma who presented to the emergency room on the night of 2/14 with confusion that had been progressing over the last 2 weeks.  Patient's baseline is alert and oriented x 3 and appropriate.  Patient's daughter noted that over the past few weeks she has had trouble talking and on day of presentation, she had gone into the garage looking for her other daughter who was not there.   In the emergency room, lab work positive for [place].  Initially thought to have CVA however CVA ruled out on MRI and CT.  Patient initially with hypertensive urgency with systolic blood pressure in the 240s and placed on Cardene drip.  Cardene drip able to be weaned off by 2/16 and mentation and speech difficulties both have improved.   Assessment and Plan: * Hypertensive emergency-resolved as of 01/05/2023 BP 252/116 on admission.  Initially placed on Cardene drip.  This is likely secondary to noncompliance for the last month patient's blood pressure medications.  Able to be weaned off of drip.  Additional medications including metoprolol, hydralazine and Imdur added.  Blood pressure now in the 160s and should improve further with diuresis.  There may be some component of mild heart failure.  Echo notes grade 1 diastolic dysfunction.  Of note, patient's mentation and speech issues are better today as blood pressure is better controlled.  Acute metabolic encephalopathy Suspect secondary to hypertensive emergency.  Stroke ruled out with negative MRI.  Neurology following.  Could also be related to medication as patient is on a number of sedating medications and at times, may not be taking these consistently.  Seems to have difficulty finding right words.  With  blood pressure control, much improved.  I do suspect the patient has some mild underlying dementia, in part from being in the hospital still contributing to this.  However her initial presentation and symptoms have resolved.  Neurology signed off.  EEG unremarkable.  Acute on chronic diastolic CHF (congestive heart failure) ([place]) With elevated blood pressures, check BNP and found to be elevated at 471.  Patient started on IV Lasix.  New finding.  Echocardiogram notes grade 1 diastolic dysfunction  [id number] Sepsis ruled out.  Initially patient thought to have COVID 1 month ago, but she was tested at that time.  She had upper respiratory symptoms and then her daughter who was caring for her got sick with confirmed COVID a week later.  Patient also thought to have pneumonia and at that time given antibiotics.  Suspect pneumonia only been in [place] now.  COVID may be playing a small role in some of her confusion.  CRP only at 1.0 though.  No need for additional medications  Stage 3b chronic kidney disease ([place]) At baseline  Anxiety Resumed home dose of Sine[place]an and Xanax.  This medication may be playing a role in some of her confusion  Controlled type 2 diabetes mellitus without complication, without long-term current use of insulin (HCC) A1c at 6.8.  Sliding scale insulin coverage.  Holding metformin.  Iron deficiency anemia due to chronic blood loss At baseline  Autoimmune disorder (HCC) History of scleroderma.  Not on immunosuppressive drugs  Acquired hypothyroidism Elevated TSH but normal free T4.  Elevated TSH likely in the setting of  acute illness Continue levothyroxine  Neck and shoulder pain Patient has chronic issues with this.  States that it is worse whenever there is a weather front happening.  Having quite a bit of discomfort starting 2/18 morning.  At home, she says she takes Tylenol and diclofenac patches.  Have ordered diclofenac patches as well as Ultram.  Also states that she  takes Xanax for this and explained that Xanax is not a pain medication.       Body mass index is 19.64 kg/m.        Consultants: Neurology  Procedures: Echocardiogram noting grade 1 diastolic dysfunction EEG Normal  Antimicrobials: None  Code Status: Full code   Subjective: Planes of severe neck and shoulder pain  Objective: Blood pressure elevated, but much improved Vitals:   01/06/23 0919 01/06/23 1231  BP: (!) 183/75 (!) 140/51  Pulse: 78 66  Resp: 18 18  Temp: 98 F (36.7 C) 98.4 F (36.9 C)  SpO2: 95% 96%    Intake/Output Summary (Last 24 hours) at 01/06/2023 1618 Last data filed at 01/06/2023 1500 Gross per 24 hour  Intake 690.8 ml  Output --  Net 690.8 ml   Filed Weights   01/02/23 2232  Weight: 53.5 kg   Body mass index is 19.64 kg/m.  Exam:  General: Alert and oriented x 3, moderate distress from pain HEENT: Normocephalic, atraumatic, mucous membranes are moist Cardiovascular: Regular rate and rhythm, S1-S2 Respiratory: Clear to auscultation bilaterally Abdomen: Soft, nontender, nondistended, positive bowel sounds Musculoskeletal: No clubbing or cyanosis or edema Skin: No skin breaks, tears or lesions Psychiatry: Appropriate, no evidence of psychoses Neurology: No focal deficits.  Some mild benign resting tremor that continues.  Her word hesitation seems to have resolved  Data Reviewed: Sed rate of 66.  Creatinine 1.45.  Disposition:  Status is: Inpatient Remains inpatient appropriate because:  -Control of blood pressure -Needs skilled nursing    Anticipated discharge date: 2/19  Family Communication: Daughter at bedside DVT Prophylaxis: enoxaparin (LOVENOX) injection 30 mg Start: 01/06/23 1000    Author: Annita Brod ,MD 01/06/2023 4:18 PM  To reach On-call, see care teams to locate the attending and reach out via [email or web address]. Between 7PM-7AM, please contact night-coverage If you still have difficulty reaching the  attending provider, please page the [facility] (Director on Call) for [facility] on amion for assistance. Diabetes mellitus

## 2023-01-07 LAB — BASIC METABOLIC PANEL
Anion gap: 11 (ref 5–15)
BUN: 32 mg/dL — ABNORMAL HIGH (ref 8–23)
CO2: 22 mmol/L (ref 22–32)
Calcium: 9.4 mg/dL (ref 8.9–10.3)
Chloride: 102 mmol/L (ref 98–111)
Creatinine, Ser: 1.49 mg/dL — ABNORMAL HIGH (ref 0.44–1.00)
GFR, Estimated: 34 mL/min — ABNORMAL LOW (ref >60)
Glucose, Bld: 173 mg/dL — ABNORMAL HIGH (ref 70–99)
Potassium: 3.4 mmol/L — ABNORMAL LOW (ref 3.5–5.1)
Sodium: 135 mmol/L (ref 135–145)

## 2023-01-07 MED ORDER — HYDRALAZINE HCL 20 MG/ML IJ SOLN
5.0000 mg | Freq: Once | INTRAMUSCULAR | Status: AC
  Administered 2023-01-07: 5 mg via INTRAVENOUS
  Filled 2023-01-07: qty 1

## 2023-01-07 MED ORDER — CARBIDOPA-LEVODOPA 10-100 MG PO TABS: 1.0000 | ORAL_TABLET | Freq: Two times a day (BID) | ORAL | 2 refills | Status: AC

## 2023-01-07 MED ORDER — TRAZODONE HCL 50 MG PO TABS: 50.0000 mg | ORAL_TABLET | Freq: Every day | ORAL | Status: AC

## 2023-01-07 MED ORDER — LOSARTAN POTASSIUM 50 MG PO TABS: 50.0000 mg | ORAL_TABLET | Freq: Every day | ORAL | 11 refills | Status: AC

## 2023-01-07 MED ORDER — DOXEPIN HCL 50 MG PO CAPS: 150.0000 mg | ORAL_CAPSULE | Freq: Every day | ORAL | Status: AC

## 2023-01-07 MED ORDER — ASPIRIN-DIPYRIDAMOLE ER 25-200 MG PO CP12: 1.0000 | ORAL_CAPSULE | Freq: Every day | ORAL | Status: AC

## 2023-01-07 MED ORDER — ALPRAZOLAM 0.5 MG PO TABS: 0.5000 mg | ORAL_TABLET | Freq: Three times a day (TID) | ORAL | Status: AC | PRN

## 2023-01-07 NOTE — TOC Transition Note (Signed)
Transition of Care [facility]) - CM/SW Discharge Note   Patient Details  Name: Andrea Shah MRN: [id number] Date of Birth: 10-Nov-1933  [facility]) CM/SW Contact:  Tiburcio Bash, LCSW Phone Number: 01/07/2023, 9:16 AM   Clinical Narrative:     Patient to discharge home today, daughter to transport, patient is set up with [facility] PT no further dc needs identified.   Final next level of care: Home w Home Health Services Barriers to Discharge: No Barriers Identified   Patient Goals and CMS Choice CMS Medicare.gov Compare Post Acute Care list provided to:: Patient Choice offered to / list presented to : Patient  Discharge Placement                         Discharge Plan and Services Additional resources added to the After Visit Summary for                            [facility] Arranged: PT [facility] Agency: [place] ([facility]) Date HH Agency Contacted: 01/07/23 Time [place]: [phone] Representative spoke with at [place]: [place] Determinants of Health ([place]) Interventions [place]: No Food Insecurity (01/04/2023)  Housing: Low Risk  (01/04/2023)  Transportation Needs: No Transportation Needs (01/04/2023)  Utilities: Not At Risk (01/04/2023)  Financial Resource Strain: Low Risk  (10/23/2017)  Physical Activity: Inactive (10/23/2017)  Social Connections: Unknown (10/23/2017)  Tobacco Use: Medium Risk (01/04/2023)     Readmission Risk Interventions     No data to display

## 2023-01-07 NOTE — Discharge Summary (Signed)
Physician Discharge Summary   Patient: Andrea Shah MRN: [id number] DOB: [birth year]  Admit date:     01/02/2023  Discharge date: 01/07/23  Discharge Physician: Annita Brod   PCP: Rusty Aus, MD   Recommendations at discharge:   Medication clarification: Patient or family member had reported taking Xanax 1 mg p.o. twice daily/3 times daily as needed.  As per PCPs last note on 1/30, should be 0.5 mg p.o. 3 times daily as needed Medication clarification: Sinemet not listed by patient or family member.  Patient should be on 10-100 p.o. twice daily according to PCP note from 1/30 Aggrenox 200-25 p.o. daily.  Patient stated that she thought her doctor had stopped this medicine.  Informed her that this medication was still listed as active as of office visit note 1/30 Medication clarification: Patient lists taking Sinequan 50 mg p.o. nightly.  Clarified this should be 3 tablets (150 mg) p.o. nightly according to PCP note New medication: Hydralazine 100 mg p.o. every 8 hours New medication: Imdur 30 mg p.o. daily New medication: Metoprolol 12.5 p.o. twice daily Medication clarification: Patient is supposed to be on trazodone 50 mg p.o. nightly according to patient's PCP note Patient set up with home health PT, OT and social work  Discharge Diagnoses: Active Problems:   Acute metabolic encephalopathy   Acute on chronic diastolic CHF (congestive heart failure) (HCC)   COVID-19   Stage 3b chronic kidney disease (HCC)   Anxiety   Iron deficiency anemia due to chronic blood loss   Controlled type 2 diabetes mellitus without complication, without long-term current use of insulin (HCC)   Autoimmune disorder (HCC)   Acquired hypothyroidism   Neck and shoulder pain  Principal Problem (Resolved):   Hypertensive emergency  Hospital Course: Hypothyroidism, hypertension, HLD, autoimmune disease/scleroderma, type 2 diabetes, prior stroke with sensory ataxia, recently treated for pneumonia  with doxycycline completed about 2 weeks priorWho presents to the ED by EMS with concerns for confusion and elevated blood pressures.  Patient was apparently found outside in her open bathroom and underwear calling her daughter's name.  According to ED provider, daughter stated that she has been intermittently confused since being treated for pneumonia and has had episodes of dysarthria and getting her words out.  She fell about 5 days ago but suffered no injury.  She is intermittently very clear and her usual self but with episodic confusion.  Her respiratory symptoms seem to have improved. ED course and data review: On arrival BP 252/116 with pulse 106 and respirations 21.  WBC 11,400 with lactic acid 2.7.  Creatinine at baseline at 1.44.  Glucose 293.  Troponin 23.  TSH elevated at 5.052.  Free T4 WNL. EKG, personally viewed and interpreted shows sinus tachycardia at 106 with no acute ST-T wave changes. CT head was nonacute Chest x-ray showed persistent findings of bronchiectasis and other as follows: IMPRESSION: Bronchiectasis and bronchial wall thickening with patchy nodular infiltrates in the left mid lung. Changes likely represent chronic bronchiectasis with fluid-filled cystic bronchiectasis. Atypical infectious process such as TB or fungal infection or soft tissue nodules would be less likely considerations. Similar findings were present on prior CT from 07/29/2019.  Patient was treated with an IV bolus of NS and started on Rocephin and azithromycin for possible CAP.  She was also given IV labetalol for BP control, though BP remained elevated with systolic at [id number] at the time of admission. Hospitalist consulted for admission for hypertensive emergency, CAP and possible stroke outside  tPA window given symptom onset a couple weeks prior.  MRI pending at the time of request for admission  Assessment and Plan: * Hypertensive emergency-resolved as of 01/05/2023 BP 252/116 on admission.  Initially  placed on Cardene drip.  This is likely secondary to noncompliance for the last month patient's blood pressure medications.  Able to be weaned off of drip.  Additional medications including metoprolol, hydralazine and Imdur added.  Blood pressure now in the 160s and should improve further with diuresis.  There may be some component of mild heart failure.  Echo notes grade 1 diastolic dysfunction.  Of note, patient's mentation and speech issues are better today as blood pressure is better controlled.  Acute metabolic encephalopathy Suspect secondary to hypertensive emergency.  Stroke ruled out with negative MRI.  Neurology following.  Could also be related to medication as patient is on a number of sedating medications and at times, may not be taking these consistently.  Seems to have difficulty finding right words.  With blood pressure control, much improved.  I do suspect the patient has some mild underlying dementia, in part from being in the hospital still contributing to this.  However her initial presentation and symptoms have resolved.  Neurology signed off.  EEG unremarkable.  I believe the cause of her symptoms was from hypertensive encephalopathy, COVID and possible withdrawal from benzodiazepines  Acute on chronic diastolic CHF (congestive heart failure) ([place]) With elevated blood pressures, check BNP and found to be elevated at 471.  Patient started on IV Lasix.  New finding.  Echocardiogram notes grade 1 diastolic dysfunction  [id number] Sepsis ruled out.  Initially patient thought to have COVID 1 month ago, but she was tested at that time.  She had upper respiratory symptoms and then her daughter who was caring for her got sick with confirmed COVID a week later.  Patient also thought to have pneumonia and at that time given antibiotics.  Suspect pneumonia only been in [place] now.  COVID may be playing a small role in some of her confusion.  CRP only at 1.0 though.  No need for additional  medications  Stage 3b chronic kidney disease ([place]) At baseline  Anxiety Resumed home dose of Sinequan and Xanax.  This medication may be playing a role in some of her confusion.  Reportedly, she is post to be on 0.5 3 times daily as needed.  Would not tube patient in for MI, she takes 1 mg 3 times a day as needed.  She may be taking more than his usual and may have run out, with that causing some of her delirium  Controlled type 2 diabetes mellitus without complication, without long-term current use of insulin (HCC) A1c at 6.8.  Sliding scale insulin coverage.  Holding metformin.  Iron deficiency anemia due to chronic blood loss At baseline  Autoimmune disorder (HCC) History of scleroderma.  Not on immunosuppressive drugs  Acquired hypothyroidism Elevated TSH but normal free T4.  Elevated TSH likely in the setting of acute illness Continue levothyroxine  Neck and shoulder pain Patient has chronic issues with this.  States that it is worse whenever there is a weather front happening.  Having quite a bit of discomfort on 2/17 and 2/18.  Treated with some Ultram and diclofenac patches.  By 2/19 symptoms have resolved.    She has some overall deconditioning.  PT and OT recommended skilled nursing which patient and family declined.  Setting up with home health PT and OT and also social  work and case they change their minds         Consultants: Neurology Procedures performed: EEG and echocardiogram Disposition: Home with home health Diet recommendation:  Discharge Diet Orders (From admission, onward)     Start     Ordered   01/06/23 0000  Diet - low sodium heart healthy        01/06/23 1732           Cardiac diet DISCHARGE MEDICATION: Allergies as of 01/07/2023       Reactions   Percocet [oxycodone-acetaminophen] Anaphylaxis   Aspirin    Does not take secondary to GI upset   Levaquin [levofloxacin In D5w]    Penicillins Swelling   Sulfa Antibiotics    With childhood  reaction of a rash   Vicodin [hydrocodone-acetaminophen] Anxiety        Medication List     TAKE these medications    acetaminophen 500 MG tablet Commonly known as: TYLENOL Take 500 mg by mouth every 6 (six) hours as needed.   ALPRAZolam 0.5 MG tablet Commonly known as: Xanax Take 1 tablet (0.5 mg total) by mouth 3 (three) times daily as needed for anxiety. What changed:  medication strength how much to take when to take this   carbidopa-levodopa 10-100 MG tablet Commonly known as: Sinemet Take 1 tablet by mouth 2 (two) times daily.   dipyridamole-aspirin 200-25 MG 12hr capsule Commonly known as: Aggrenox Take 1 capsule by mouth daily.   doxepin 50 MG capsule Commonly known as: SINEQUAN Take 3 capsules (150 mg total) by mouth at bedtime. What changed: how much to take   glucose blood test strip   hydrALAZINE 100 MG tablet Commonly known as: APRESOLINE Take 1 tablet (100 mg total) by mouth every 8 (eight) hours.   hydrochlorothiazide 25 MG tablet Commonly known as: HYDRODIURIL Take 1 tablet (25 mg total) by mouth daily.   isosorbide mononitrate 30 MG 24 hr tablet Commonly known as: IMDUR Take 1 tablet (30 mg total) by mouth daily.   levothyroxine 100 MCG tablet Commonly known as: SYNTHROID Take 100 mcg by mouth daily before breakfast.   losartan 50 MG tablet Commonly known as: Cozaar Take 1 tablet (50 mg total) by mouth daily.   meclizine 12.5 MG tablet Commonly known as: ANTIVERT Take 12.5 mg by mouth.   metFORMIN 500 MG 24 hr tablet Commonly known as: GLUCOPHAGE-XR   metoprolol tartrate 25 MG tablet Commonly known as: LOPRESSOR Take 0.5 tablets (12.5 mg total) by mouth 2 (two) times daily.   multivitamin tablet Take by mouth.   omeprazole 10 MG capsule Commonly known as: PRILOSEC Take by mouth.   thiamine 100 MG tablet Commonly known as: Vitamin B-1 Take 1 tablet (100 mg total) by mouth daily.   traZODone 50 MG tablet Commonly known  as: DESYREL Take 1 tablet (50 mg total) by mouth at bedtime.   vitamin B-12 100 MCG tablet Commonly known as: CYANOCOBALAMIN Take by mouth.   zolpidem 10 MG tablet Commonly known as: AMBIEN        Follow-up Information     Rusty Aus, MD. Schedule an appointment as soon as possible for a visit in 2 week(s).   Specialty: Internal Medicine Contact information: [place] [address] [phone]                Discharge Exam: Danley Danker Weights   01/02/23 2232  Weight: 53.5 kg   General: Alert and  oriented x 2, no acute distress Cardiovascular: Regular rate and rhythm, S1-S2  Condition at discharge: good  The results of significant diagnostics from this hospitalization (including imaging, microbiology, ancillary and laboratory) are listed below for reference.   Imaging Studies: EEG adult  Result Date: Jan 30, 2023 Derek Jack, MD     January 30, 2023  8:26 PM Routine EEG Report Andrea Shah is a 89 y.o. female with a history of altered mental status who is undergoing an EEG to evaluate for seizures. Report: This EEG was acquired with electrodes placed according to the International 10-20 electrode system (including Fp1, Fp2, F3, F4, C3, C4, P3, P4, O1, O2, T3, T4, T5, T6, A1, A2, Fz, Cz, Pz). The following electrodes were missing or displaced: none. The occipital dominant rhythm was 8.5 Hz. This activity is reactive to stimulation. Drowsiness was manifested by background fragmentation; deeper stages of sleep were identified by K complexes and sleep spindles. There was no focal slowing. There were no interictal epileptiform discharges. There were no electrographic seizures identified. Photic stimulation and hyperventilation were not performed. Impression: This EEG was obtained while awake and asleep and is normal.   Clinical Correlation: Normal EEGs, however, do not rule out epilepsy. Su Monks, MD [facility] [phone] If 7pm- 7am, please page neurology on call as listed in [place].   ECHOCARDIOGRAM COMPLETE  Result Date: Jan 30, 2023    ECHOCARDIOGRAM REPORT   Patient Name:   Andrea Shah Date of Exam: 01/30/23 Medical Rec #:  [id number]      Height:       65.0 in Accession #:    [id number]     Weight:       118.0 lb Date of Birth:  04/27/1933      BSA:          1.581 m Patient Age:    26 years       BP:           171/66 mmHg Patient Gender: F              HR:           97 bpm. Exam Location:  [facility] Procedure: 2D Echo, Cardiac Doppler and Color Doppler Indications:     Stroke  History:         Patient has no prior history of Echocardiogram examinations.                  Stroke; Risk Factors:Hypertension and Diabetes. COVID +.  Sonographer:     Wenda Low Referring Phys:  [id number] Athena Masse Diagnosing Phys: Neoma Laming  Sonographer Comments: Technically difficult study due to poor echo windows and suboptimal apical window. IMPRESSIONS  1. Left ventricular ejection fraction, by estimation, is 60 to 65%. The left ventricle has normal function. The left ventricle has no regional wall motion abnormalities. Left ventricular diastolic parameters are consistent with Grade I diastolic dysfunction (impaired relaxation).  2. Right ventricular systolic function is normal. The right ventricular size is normal. There is normal pulmonary artery systolic pressure.  3. Left atrial size was mildly dilated.  4. The mitral valve is normal in structure. Mild mitral valve regurgitation. No evidence of mitral stenosis.  5. The aortic valve is normal in structure. Aortic valve regurgitation is mild. Aortic valve sclerosis/calcification is present, without any evidence of aortic stenosis.  6. The inferior vena cava is normal in size with greater than 50% respiratory variability, suggesting right atrial pressure of 3 mmHg. FINDINGS  Left Ventricle: Left ventricular ejection fraction, by estimation, is 60 to 65%.  The left ventricle has normal function. The left ventricle has no regional wall motion abnormalities. The left ventricular internal cavity size was normal in size. There is  no left ventricular hypertrophy. Left ventricular diastolic parameters are consistent with Grade I diastolic dysfunction (impaired relaxation). Right Ventricle: The right ventricular size is normal. No increase in right ventricular wall thickness. Right ventricular systolic function is normal. There is normal pulmonary artery systolic pressure. The tricuspid regurgitant velocity is 2.23 m/s, and  with an assumed right atrial pressure of 3 mmHg, the estimated right ventricular systolic pressure is [id number] mmHg. Left Atrium: Left atrial size was mildly dilated. Right Atrium: Right atrial size was normal in size. Pericardium: There is no evidence of pericardial effusion. Mitral Valve: The mitral valve is normal in structure. Mild mitral valve regurgitation. No evidence of mitral valve stenosis. MV peak gradient, 11.4 mmHg. The mean mitral valve gradient is 5.0 mmHg. Tricuspid Valve: The tricuspid valve is normal in structure. Tricuspid valve regurgitation is trivial. No evidence of tricuspid stenosis. Aortic Valve: The aortic valve is normal in structure. Aortic valve regurgitation is mild. Aortic valve sclerosis/calcification is present, without any evidence of aortic stenosis. Aortic valve mean gradient measures 7.0 mmHg. Aortic valve peak gradient measures 11.6 mmHg. Aortic valve area, by VTI measures 2.88 cm. Pulmonic Valve: The pulmonic valve was normal in structure. Pulmonic valve regurgitation is not visualized. No evidence of pulmonic stenosis. Aorta: The aortic root is normal in size and structure. Venous: The inferior vena cava is normal in size with greater than 50% respiratory variability, suggesting right atrial pressure of 3 mmHg. IAS/Shunts: No atrial level shunt detected by color flow Doppler.  LEFT VENTRICLE PLAX 2D LVIDd:          3.90 cm LVIDs:         2.60 cm LV PW:         1.10 cm LV IVS:        1.00 cm LVOT diam:     1.90 cm LV SV:         100 LV SV Index:   63 LVOT Area:     2.84 cm  RIGHT VENTRICLE RV Basal diam:  3.45 cm RV Mid diam:    2.70 cm RV S prime:     19.20 cm/s TAPSE (M-mode): 2.6 cm LEFT ATRIUM           Index        RIGHT ATRIUM           Index LA diam:      3.40 cm 2.15 cm/m   RA Area:     14.20 cm LA Vol (A4C): 31.1 ml 19.67 ml/m  RA Volume:   35.50 ml  22.46 ml/m  AORTIC VALVE                     PULMONIC VALVE AV Area (Vmax):    2.54 cm      PV Vmax:       1.99 m/s AV Area (Vmean):   2.69 cm      PV Peak grad:  15.8 mmHg AV Area (VTI):     2.88 cm AV Vmax:           170.00 cm/s AV Vmean:          119.000 cm/s AV VTI:            0.347 m  AV Peak Grad:      11.6 mmHg AV Mean Grad:      7.0 mmHg LVOT Vmax:         152.00 cm/s LVOT Vmean:        113.000 cm/s LVOT VTI:          0.353 m LVOT/AV VTI ratio: 1.02  AORTA Ao Root diam: 3.95 cm Ao Asc diam:  3.60 cm MITRAL VALVE                TRICUSPID VALVE MV Area (PHT): 4.00 cm     TR Peak grad:   19.9 mmHg MV Area VTI:   3.25 cm     TR Vmax:        223.00 cm/s MV Peak grad:  11.4 mmHg MV Mean grad:  5.0 mmHg     SHUNTS MV Vmax:       1.69 m/s     Systemic VTI:  0.35 m MV Vmean:      96.2 cm/s    Systemic Diam: 1.90 cm MV E velocity: 106.00 cm/s MV A velocity: 52.90 cm/s MV E/A ratio:  2.00 Shaukat Khan Electronically signed by Neoma Laming Signature Date/Time: 01/04/2023/12:01:15 PM    Final    US Carotid Bilateral (at [facility] and [facility] only)  Result Date: 01/03/2023 CLINICAL DATA:  Stroke, hypertension, hyperlipidemia, diabetes EXAM: BILATERAL CAROTID DUPLEX ULTRASOUND TECHNIQUE: Pearline Cables scale imaging, color Doppler and duplex ultrasound were performed of bilateral carotid and vertebral arteries in the neck. COMPARISON:  02/13/2011 by report only FINDINGS: Criteria: Quantification of carotid stenosis is based on velocity parameters that correlate the residual internal  carotid diameter with NASCET-based stenosis levels, using the diameter of the distal internal carotid lumen as the denominator for stenosis measurement. The following velocity measurements were obtained: RIGHT ICA: 215/29 cm/sec CCA: [id number] cm/sec SYSTOLIC ICA/CCA RATIO:  3.1 ECA: 200 cm/sec LEFT ICA: 108/15 cm/sec CCA: [id number] cm/sec SYSTOLIC ICA/CCA RATIO:  1.3 ECA: 130 cm/sec RIGHT CAROTID ARTERY: Tortuous common carotid with diffuse intimal thickening. Partially calcified eccentric plaque in the carotid bulb, proximal and mid ICA with at least moderate stenosis with velocities up to 130 cm/seconds. Poor angle correction in the distal ICA results in spurious velocities. Normal waveforms and color Doppler signal throughout. RIGHT VERTEBRAL ARTERY:  Normal flow direction and waveform. LEFT CAROTID ARTERY: Diffuse intimal thickening in the common carotid artery. Eccentric partially calcified plaque in the bulb and ICA origin resulting in at least mild stenosis. Normal waveforms and color Doppler signal throughout. LEFT VERTEBRAL ARTERY:  Normal flow direction and waveform. IMPRESSION: 1. Bilateral carotid bifurcation plaque resulting in 50-69% diameter right ICA stenosis, less than 50% diameter left ICA stenosis. 2. Antegrade bilateral vertebral arterial flow. Electronically Signed   By: Lucrezia Europe M.D.   On: 01/03/2023 15:11   MR BRAIN WO CONTRAST  Result Date: 01/03/2023 CLINICAL DATA:  Altered mental status, hypertension EXAM: MRI HEAD WITHOUT CONTRAST TECHNIQUE: Multiplanar, multiecho pulse sequences of the brain and surrounding structures were obtained without intravenous contrast. COMPARISON:  02/13/2011 MRI head, correlation is also made with CT head 01/02/2023 FINDINGS: Brain: No restricted diffusion to suggest acute or subacute infarct. No acute hemorrhage, mass, mass effect, or midline shift. No hydrocephalus or extra-axial collection. Normal pituitary and craniocervical junction. No hemosiderin deposition  to suggest remote hemorrhage. Remote cortical infarct in the right frontal lobe (series 15, image 30). Cerebral volume is within normal limits for age. Vascular: Normal arterial flow voids. Skull and upper cervical  spine: Normal marrow signal. Sinuses/Orbits: Clear paranasal sinuses. No acute finding in the orbits. Other: The mastoid air cells are well aerated. IMPRESSION: No acute intracranial process. No evidence of acute or subacute infarct. Electronically Signed   By: Merilyn Baba M.D.   On: 01/03/2023 00:38   CT Head Wo Contrast  Result Date: 01/02/2023 CLINICAL DATA:  Mental status change of unknown cause. History of COVID and pneumonia month ago. High blood pressure today. Confusion yesterday. EXAM: CT HEAD WITHOUT CONTRAST TECHNIQUE: Contiguous axial images were obtained from the base of the skull through the vertex without intravenous contrast. RADIATION DOSE REDUCTION: This exam was performed according to the departmental dose-optimization program which includes automated exposure control, adjustment of the mA and/or kV according to patient size and/or use of iterative reconstruction technique. COMPARISON:  MRI brain 02/13/2011.  CT head 02/12/2011 FINDINGS: Brain: Diffuse cerebral atrophy. Ventricular dilatation consistent with central atrophy. Low-attenuation changes in the deep white matter consistent with small vessel ischemia. No abnormal extra-axial fluid collections. No mass effect or midline shift. Gray-white matter junctions are distinct. Basal cisterns are not effaced. No acute intracranial hemorrhage. Vascular: No hyperdense vessel or unexpected calcification. Skull: Calvarium appears intact. Sinuses/Orbits: Secretions demonstrated in the sphenoid sinus. Paranasal sinuses and mastoid air cells are otherwise clear. Other: None. IMPRESSION: No acute intracranial abnormalities. Chronic atrophy and small vessel ischemic changes. Electronically Signed   By: Lucienne Capers M.D.   On: 01/02/2023  23:08   DG Chest 1 View  Result Date: 01/02/2023 CLINICAL DATA:  Altered mental status.  Chest pain EXAM: CHEST  1 VIEW COMPARISON:  12/05/2012 FINDINGS: Heart size and pulmonary vascularity are normal. Bronchiectasis with bronchial wall thickening. Patchy nodular infiltrates in the left mid lung, some demonstrating central lucency. No pleural effusions. No pneumothorax. Mediastinal contours appear intact. Calcification of the aorta. IMPRESSION: Bronchiectasis and bronchial wall thickening with patchy nodular infiltrates in the left mid lung. Changes likely represent chronic bronchiectasis with fluid-filled cystic bronchiectasis. Atypical infectious process such as TB or fungal infection or soft tissue nodules would be less likely considerations. Similar findings were present on prior CT from 07/29/2019. Electronically Signed   By: Lucienne Capers M.D.   On: 01/02/2023 22:54    Microbiology: Results for orders placed or performed during the hospital encounter of 01/02/23  Resp panel by RT-PCR (RSV, Flu A&B, Covid) Anterior Nasal Swab     Status: Abnormal   Collection Time: 01/03/23  1:56 AM   Specimen: Anterior Nasal Swab  Result Value Ref Range Status   SARS Coronavirus 2 by RT PCR POSITIVE (A) NEGATIVE Final    Comment: (NOTE) SARS-CoV-2 target nucleic acids are DETECTED.  The SARS-CoV-2 RNA is generally detectable in upper respiratory specimens during the acute phase of infection. Positive results are indicative of the presence of the identified virus, but do not rule out bacterial infection or co-infection with other pathogens not detected by the test. Clinical correlation with patient history and other diagnostic information is necessary to determine patient infection status. The expected result is Negative.  Fact Sheet for Patients: EntrepreneurPulse.com.au  Fact Sheet for Healthcare Providers: IncredibleEmployment.be  This test is not yet  approved or cleared by the [place] FDA and  has been authorized for detection and/or diagnosis of SARS-CoV-2 by FDA under an Emergency Use Authorization (EUA).  This EUA will remain in effect (meaning this test can be used) for the duration of  the COVID-19 declaration under Section 564(b)(1) of the A ct, 21 U.S.C.  section 360bbb-3(b)(1), unless the authorization is terminated or revoked sooner.     Influenza A by PCR NEGATIVE NEGATIVE Final   Influenza B by PCR NEGATIVE NEGATIVE Final    Comment: (NOTE) The Xpert Xpress SARS-CoV-2/FLU/RSV plus assay is intended as an aid in the diagnosis of influenza from Nasopharyngeal swab specimens and should not be used as a sole basis for treatment. Nasal washings and aspirates are unacceptable for Xpert Xpress SARS-CoV-2/FLU/RSV testing.  Fact Sheet for Patients: EntrepreneurPulse.com.au  Fact Sheet for Healthcare Providers: [employer]  This test is not yet approved or cleared by the [place] FDA and has been authorized for detection and/or diagnosis of SARS-CoV-2 by FDA under an Emergency Use Authorization (EUA). This EUA will remain in effect (meaning this test can be used) for the duration of the COVID-19 declaration under Section 564(b)(1) of the Act, 21 U.S.C. section 360bbb-3(b)(1), unless the authorization is terminated or revoked.     Resp Syncytial Virus by PCR NEGATIVE NEGATIVE Final    Comment: (NOTE) Fact Sheet for Patients: Ent[employer]urPulse.com.au  Fact Sheet for Healthcare Providers: [employer]  This test is not yet approved or cleared by the [place] FDA and has been authorized for detection and/or diagnosis of SARS-CoV-2 by FDA under an Emergency Use Authorization (EUA). This EUA will remain in effect (meaning this test can be used) for the duration of the COVID-19 declaration under Section 564(b)(1) of  the Act, 21 U.S.C. section 360bbb-3(b)(1), unless the authorization is terminated or revoked.  Performed at [facility], [address] [place], [place] [address]     Labs: CBC: Recent Labs  Lab 01/02/23 2227  WBC 11.4*  NEUTROABS 8.3*  HGB 10.0*  HCT 32.1*  MCV 93.6  PLT 0000000   Basic Metabolic Panel: Recent Labs  Lab 01/02/23 2227 01/04/23 0454 01/05/23 0622 01/06/23 0338 01/07/23 0428  NA 139 138 138 138 135  K 4.1 3.7 3.6 3.5 3.4*  CL 105 110 107 108 102  CO2 21* 20* 20* 21* 22  GLUCOSE 293* 171* 142* 161* 173*  BUN 32* 26* 30* 33* 32*  CREATININE 1.44* 1.08* 1.42* 1.45* 1.49*  CALCIUM 9.3 8.7* 9.1 9.0 9.4   Liver Function Tests: Recent Labs  Lab 01/02/23 2227  AST 34  ALT 21  ALKPHOS 77  BILITOT 0.7  PROT 7.3  ALBUMIN 3.5   CBG: No results for input(s): "GLUCAP" in the last 168 hours.  Discharge time spent: less than 30 minutes.  Signed: Annita Brod, MD [facility] 2/19/2024Underlying cause I suspect is in part due to hypertensive encephalopathy, in

## 2023-01-07 NOTE — Care Management Important Message (Signed)
Important Message  Patient Details  Name: Andrea Shah MRN: [id number] Date of Birth: [birth year]   Medicare Important Message Given:  Yes  Reviewed Medicare IM with Flonnie Overman, daughter, via room phone [phone].  Copy of Medicare IM to be delivered to room due to isolation status.   Dannette Barbara 01/07/2023, 8:58 AM

## 2023-01-10 LAB — VITAMIN B1: Vitamin B1 (Thiamine): 152.7 nmol/L (ref 66.5–200.0)

## 2023-01-10 NOTE — TOC Progression Note (Signed)
Transition of Care [facility]) - Progression Note    Patient Details  Name: CHRISTINEMARIE ROUCH MRN: [id number] Date of Birth: May 21, 1933  Transition of Care [facility]) CM/SW [place], [place] Phone Number: 01/10/2023, 9:24 AM  Clinical Narrative:     Patient was previously active with [facility], CSW received call from patient's daughter (2/22) that no one has reached out to them to start services, [place] with [place] updated as she reports clerical error. They will follow up with patient/family today to start services.    Expected Discharge Plan: [place] Barriers to Discharge: No Barriers Identified  Expected Discharge Plan and Services       Living arrangements for the past 2 months: Single Family Home Expected Discharge Date: 01/06/23                         HH Arranged: PT [place]: [place] ([place]) Date [place]: 01/07/23 Time [place]: [phone] Representative spoke with at [place]: [place] Determinants of Health ([place]) Interventions SDOH Screenings   Food Insecurity: No Food Insecurity (01/04/2023)  Housing: Low Risk  (01/04/2023)  Transportation Needs: No Transportation Needs (01/04/2023)  Utilities: Not At Risk (01/04/2023)  Financial Resource Strain: Low Risk  (10/23/2017)  Physical Activity: Inactive (10/23/2017)  Social Connections: Unknown (10/23/2017)  Tobacco Use: Medium Risk (01/04/2023)    Readmission Risk Interventions     No data to display
